# Patient Record
Sex: Female | Born: 1975 | Race: White | Hispanic: No | Smoking: Current every day smoker
Health system: Southern US, Community
[De-identification: ages and names within clinical notes are randomized; demographics above are authoritative.]

## PROBLEM LIST (undated history)

## (undated) DIAGNOSIS — S069X9A Unspecified intracranial injury with loss of consciousness of unspecified duration, initial encounter: Secondary | ICD-10-CM

## (undated) DIAGNOSIS — G9050 Complex regional pain syndrome I, unspecified: Secondary | ICD-10-CM

## (undated) DIAGNOSIS — F329 Major depressive disorder, single episode, unspecified: Secondary | ICD-10-CM

## (undated) DIAGNOSIS — N289 Disorder of kidney and ureter, unspecified: Secondary | ICD-10-CM

## (undated) DIAGNOSIS — S069XAA Unspecified intracranial injury with loss of consciousness status unknown, initial encounter: Secondary | ICD-10-CM

## (undated) DIAGNOSIS — I10 Essential (primary) hypertension: Secondary | ICD-10-CM

## (undated) DIAGNOSIS — R51 Headache: Secondary | ICD-10-CM

## (undated) DIAGNOSIS — R519 Headache, unspecified: Secondary | ICD-10-CM

## (undated) DIAGNOSIS — R911 Solitary pulmonary nodule: Secondary | ICD-10-CM

## (undated) DIAGNOSIS — F32A Depression, unspecified: Secondary | ICD-10-CM

## (undated) HISTORY — PX: ABDOMINAL HYSTERECTOMY: SHX81

## (undated) HISTORY — PX: BRAIN SURGERY: SHX531

## (undated) HISTORY — PX: SHOULDER ARTHROSCOPY: SHX128

---

## 2015-10-10 ENCOUNTER — Emergency Department (HOSPITAL_COMMUNITY): Payer: Medicare Other

## 2015-10-10 ENCOUNTER — Emergency Department (HOSPITAL_COMMUNITY)
Admission: EM | Admit: 2015-10-10 | Discharge: 2015-10-11 | Discharge status: Against Medical Advice | Payer: Medicare Other | Attending: Emergency Medicine | Admitting: Emergency Medicine

## 2015-10-10 ENCOUNTER — Encounter (HOSPITAL_COMMUNITY): Payer: Self-pay | Admitting: Emergency Medicine

## 2015-10-10 DIAGNOSIS — R079 Chest pain, unspecified: Secondary | ICD-10-CM | POA: Diagnosis present

## 2015-10-10 DIAGNOSIS — Z79899 Other long term (current) drug therapy: Secondary | ICD-10-CM | POA: Diagnosis not present

## 2015-10-10 DIAGNOSIS — F17210 Nicotine dependence, cigarettes, uncomplicated: Secondary | ICD-10-CM | POA: Diagnosis not present

## 2015-10-10 DIAGNOSIS — Z791 Long term (current) use of non-steroidal anti-inflammatories (NSAID): Secondary | ICD-10-CM | POA: Insufficient documentation

## 2015-10-10 DIAGNOSIS — I10 Essential (primary) hypertension: Secondary | ICD-10-CM | POA: Insufficient documentation

## 2015-10-10 DIAGNOSIS — F329 Major depressive disorder, single episode, unspecified: Secondary | ICD-10-CM | POA: Insufficient documentation

## 2015-10-10 HISTORY — DX: Essential (primary) hypertension: I10

## 2015-10-10 HISTORY — DX: Unspecified intracranial injury with loss of consciousness status unknown, initial encounter: S06.9XAA

## 2015-10-10 HISTORY — DX: Unspecified intracranial injury with loss of consciousness of unspecified duration, initial encounter: S06.9X9A

## 2015-10-10 HISTORY — DX: Depression, unspecified: F32.A

## 2015-10-10 HISTORY — DX: Major depressive disorder, single episode, unspecified: F32.9

## 2015-10-10 HISTORY — DX: Complex regional pain syndrome I, unspecified: G90.50

## 2015-10-10 LAB — BASIC METABOLIC PANEL
Anion gap: 5 (ref 5–15)
BUN: 17 mg/dL (ref 6–20)
CHLORIDE: 106 mmol/L (ref 101–111)
CO2: 28 mmol/L (ref 22–32)
Calcium: 8.9 mg/dL (ref 8.9–10.3)
Creatinine, Ser: 0.84 mg/dL (ref 0.44–1.00)
GFR calc Af Amer: >60 mL/min (ref >60)
Glucose, Bld: 106 mg/dL — ABNORMAL HIGH (ref 65–99)
Potassium: 3.6 mmol/L (ref 3.5–5.1)
Sodium: 139 mmol/L (ref 135–145)

## 2015-10-10 LAB — CBC
HEMATOCRIT: 40.2 % (ref 36.0–46.0)
HEMOGLOBIN: 13.4 g/dL (ref 12.0–15.0)
MCH: 31.7 pg (ref 26.0–34.0)
MCHC: 33.3 g/dL (ref 30.0–36.0)
MCV: 95 fL (ref 78.0–100.0)
Platelets: 197 10*3/uL (ref 150–400)
RBC: 4.23 MIL/uL (ref 3.87–5.11)
RDW: 12.6 % (ref 11.5–15.5)
WBC: 5.3 10*3/uL (ref 4.0–10.5)

## 2015-10-10 LAB — TROPONIN I: Troponin I: <0.03 ng/mL (ref <0.03)

## 2015-10-10 NOTE — ED Provider Notes (Signed)
CSN: [id number]     Arrival date & time 10/10/15  1928 History   First MD Initiated Contact with Patient 10/10/15 2238     Chief Complaint  Patient presents with  . Chest Pain     (Consider location/radiation/quality/duration/timing/severity/associated sxs/prior Treatment) Patient is a 40 y.o. female presenting with chest pain. The history is provided by the patient. No language interpreter was used.  Chest Pain Pain location:  L chest Pain quality: aching   Pain radiates to:  Does not radiate Pain radiates to the back: no   Pain severity:  Moderate Onset quality:  Gradual Duration:  1 day Timing:  Constant Progression:  Worsening Chronicity:  New Context: stress   Context: not breathing and no movement   Relieved by:  Nothing Worsened by:  Nothing tried Ineffective treatments:  None tried Associated symptoms: no abdominal pain and not vomiting   Risk factors: no birth control and no prior DVT/PE   Pt reports she had a 12 hour bus trip here to visit her family.    Past Medical History  Diagnosis Date  . Traumatic brain injury (HCC)   . Hypertension     Past  . Reflex sympathetic dystrophy   . Depression    Past Surgical History  Procedure Laterality Date  . Shoulder arthroscopy    . Abdominal hysterectomy     Family History  Problem Relation Age of Onset  . Diabetes Mother   . Diabetes Other    Social History  Substance Use Topics  . Smoking status: Current Every Day Smoker -- 1.00 packs/day    Types: Cigarettes  . Smokeless tobacco: Never Used  . Alcohol Use: No   OB History    Gravida Para Term Preterm AB TAB SAB Ectopic Multiple Living   3 3 3             Review of Systems  Cardiovascular: Positive for chest pain.  Gastrointestinal: Negative for vomiting and abdominal pain.  All other systems reviewed and are negative.     Allergies  Celexa and Contrast media  Home Medications   Prior to Admission medications   Medication Sig Start Date End  Date Taking? Authorizing Provider  clonazePAM (KLONOPIN) 1 MG tablet take 1 tablet by mouth three times a day if needed 09/18/15  Yes Historical Provider, MD  ibuprofen (ADVIL,MOTRIN) 200 MG tablet Take 600 mg by mouth every 6 (six) hours as needed.   Yes Historical Provider, MD  QUEtiapine (SEROQUEL) 100 MG tablet TAKE 1 TABLET BY MOUTH EVERY DAY IN THE MORNING & TAKE 2 TABLETS IN THE EVENING X 10 DAYS 07/05/15  Yes Historical Provider, MD   BP 139/93 mmHg  Pulse 90  Temp(Src) 98.1 F (36.7 C) (Oral)  Resp 21  Ht 5\' 8"  (1.727 m)  Wt 58.968 kg  BMI 19.77 kg/m2  SpO2 100% Physical Exam  Constitutional: She is oriented to person, place, and time. She appears well-developed and well-nourished.  HENT:  Head: Normocephalic and atraumatic.  Right Ear: External ear normal.  Left Ear: External ear normal.  Nose: Nose normal.  Mouth/Throat: Oropharynx is clear and moist.  Eyes: Conjunctivae and EOM are normal. Pupils are equal, round, and reactive to light.  Neck: Normal range of motion.  Cardiovascular: Normal rate and normal heart sounds.   Pulmonary/Chest: Effort normal.  Abdominal: Soft. She exhibits no distension.  Musculoskeletal: Normal range of motion.  Neurological: She is alert and oriented to person, place, and time.  Skin: Skin is  warm.  Psychiatric: She has a normal mood and affect.  Nursing note and vitals reviewed.   ED Course  Procedures (including critical care time) Labs Review Labs Reviewed  BASIC METABOLIC PANEL  CBC  TROPONIN I    Imaging Review Dg Chest 2 View  10/10/2015  CLINICAL DATA:  Chest pain with radiation down left arm. EXAM: CHEST  2 VIEW COMPARISON:  None. FINDINGS: The heart size and mediastinal contours are within normal limits. Both lungs are clear. The visualized skeletal structures are unremarkable. IMPRESSION: No active cardiopulmonary disease. Electronically Signed   By: Kennith CenterEric  Mansell M.D.   On: 10/10/2015 20:09   I have personally reviewed  and evaluated these images and lab results as part of my medical decision-making.   EKG Interpretation   Date/Time:  Saturday October 10 2015 19:50:56 EDT Ventricular Rate:  73 PR Interval:  114 QRS Duration: 92 QT Interval:  396 QTC Calculation: 436 R Axis:   83 Text Interpretation:  Normal sinus rhythm Normal ECG No previous ECGs  available Confirmed by RANCOUR  MD, STEPHEN ([id number]) on 10/10/2015 10:12:44  PM      MDM  Pt left ams before ddimer results.  Pt has normal EKg and chest xray   Final diagnoses:  Chest pain, unspecified chest pain type    AMA.    Lonia SkinnerLeslie K Pasadena ParkSofia, PA-C 10/11/15 0041  Bethann BerkshireJoseph Zammit, MD 10/11/15 [phone]

## 2015-10-10 NOTE — ED Notes (Signed)
Pt c/o chest pain with radiation down her L arm. Pt also reports dizziness and nausea. Onset of symptoms yesterday.

## 2015-10-11 LAB — D-DIMER, QUANTITATIVE (NOT AT ARMC)

## 2015-10-11 NOTE — ED Notes (Signed)
Patient elected to leave AMA,

## 2015-10-12 ENCOUNTER — Encounter (HOSPITAL_COMMUNITY): Payer: Self-pay | Admitting: *Deleted

## 2015-10-12 ENCOUNTER — Emergency Department (HOSPITAL_COMMUNITY): Payer: Medicare Other

## 2015-10-12 ENCOUNTER — Emergency Department (HOSPITAL_COMMUNITY)
Admission: EM | Admit: 2015-10-12 | Discharge: 2015-10-12 | Disposition: A | Discharge status: Home / Self Care | Payer: Medicare Other | Attending: Emergency Medicine | Admitting: Emergency Medicine

## 2015-10-12 DIAGNOSIS — F329 Major depressive disorder, single episode, unspecified: Secondary | ICD-10-CM | POA: Diagnosis not present

## 2015-10-12 DIAGNOSIS — I10 Essential (primary) hypertension: Secondary | ICD-10-CM | POA: Insufficient documentation

## 2015-10-12 DIAGNOSIS — R1033 Periumbilical pain: Secondary | ICD-10-CM | POA: Insufficient documentation

## 2015-10-12 DIAGNOSIS — F17210 Nicotine dependence, cigarettes, uncomplicated: Secondary | ICD-10-CM | POA: Diagnosis not present

## 2015-10-12 DIAGNOSIS — R109 Unspecified abdominal pain: Secondary | ICD-10-CM

## 2015-10-12 LAB — CBC
HEMATOCRIT: 43.4 % (ref 36.0–46.0)
HEMOGLOBIN: 14.7 g/dL (ref 12.0–15.0)
MCH: 32.2 pg (ref 26.0–34.0)
MCHC: 33.9 g/dL (ref 30.0–36.0)
MCV: 95.2 fL (ref 78.0–100.0)
Platelets: 217 10*3/uL (ref 150–400)
RBC: 4.56 MIL/uL (ref 3.87–5.11)
RDW: 12.5 % (ref 11.5–15.5)
WBC: 6.3 10*3/uL (ref 4.0–10.5)

## 2015-10-12 LAB — COMPREHENSIVE METABOLIC PANEL
ALT: 11 U/L — ABNORMAL LOW (ref 14–54)
ANION GAP: 7 (ref 5–15)
AST: 17 U/L (ref 15–41)
Albumin: 4.8 g/dL (ref 3.5–5.0)
Alkaline Phosphatase: 70 U/L (ref 38–126)
BILIRUBIN TOTAL: 0.8 mg/dL (ref 0.3–1.2)
BUN: 14 mg/dL (ref 6–20)
CHLORIDE: 106 mmol/L (ref 101–111)
CO2: 27 mmol/L (ref 22–32)
Calcium: 9.7 mg/dL (ref 8.9–10.3)
Creatinine, Ser: 0.67 mg/dL (ref 0.44–1.00)
GFR calc Af Amer: >60 mL/min (ref >60)
Glucose, Bld: 114 mg/dL — ABNORMAL HIGH (ref 65–99)
POTASSIUM: 3.4 mmol/L — AB (ref 3.5–5.1)
Sodium: 140 mmol/L (ref 135–145)
TOTAL PROTEIN: 7.5 g/dL (ref 6.5–8.1)

## 2015-10-12 LAB — TROPONIN I: Troponin I: <0.03 ng/mL (ref <0.03)

## 2015-10-12 LAB — LIPASE, BLOOD: LIPASE: 22 U/L (ref 11–51)

## 2015-10-12 NOTE — ED Provider Notes (Signed)
CSN: [id number]     Arrival date & time 10/12/15  1325 History   First MD Initiated Contact with Patient 10/12/15 [phone]     Chief Complaint  Patient presents with  . Follow-up  . Abdominal Pain     (Consider location/radiation/quality/duration/timing/severity/associated sxs/prior Treatment) Patient is a 40 y.o. female presenting with abdominal pain. The history is provided by the patient (Patient states she's been having some mild lower abdominal pain. Also she stated that her EKG was abnormal and was told to come back to be evaluated).  Abdominal Pain Pain location:  Periumbilical Pain quality: aching   Pain radiates to:  Does not radiate Pain severity:  Mild Onset quality:  Sudden Timing:  Intermittent Progression:  Waxing and waning Chronicity:  Recurrent Context: not alcohol use   Associated symptoms: no chest pain, no cough, no diarrhea, no fatigue and no hematuria     Past Medical History  Diagnosis Date  . Traumatic brain injury (HCC)   . Hypertension     Past  . Reflex sympathetic dystrophy   . Depression    Past Surgical History  Procedure Laterality Date  . Shoulder arthroscopy    . Abdominal hysterectomy     Family History  Problem Relation Age of Onset  . Diabetes Mother   . Diabetes Other    Social History  Substance Use Topics  . Smoking status: Current Every Day Smoker -- 1.00 packs/day    Types: Cigarettes  . Smokeless tobacco: Never Used  . Alcohol Use: No   OB History    Gravida Para Term Preterm AB TAB SAB Ectopic Multiple Living   3 3 3             Review of Systems  Constitutional: Negative for appetite change and fatigue.  HENT: Negative for congestion, ear discharge and sinus pressure.   Eyes: Negative for discharge.  Respiratory: Negative for cough.   Cardiovascular: Negative for chest pain.  Gastrointestinal: Positive for abdominal pain. Negative for diarrhea.  Genitourinary: Negative for frequency and hematuria.  Musculoskeletal:  Negative for back pain.  Skin: Negative for rash.  Neurological: Negative for seizures and headaches.  Psychiatric/Behavioral: Negative for hallucinations.      Allergies  Celexa and Contrast media  Home Medications   Prior to Admission medications   Medication Sig Start Date End Date Taking? Authorizing Provider  clonazePAM (KLONOPIN) 1 MG tablet take 1 tablet by mouth three times a day if needed 09/18/15   Historical Provider, MD  ibuprofen (ADVIL,MOTRIN) 200 MG tablet Take 600 mg by mouth every 6 (six) hours as needed.    Historical Provider, MD  QUEtiapine (SEROQUEL) 100 MG tablet TAKE 1 TABLET BY MOUTH EVERY DAY IN THE MORNING & TAKE 2 TABLETS IN THE EVENING X 10 DAYS 07/05/15   Historical Provider, MD   BP 136/84 mmHg  Pulse 104  Temp(Src) 98.5 F (36.9 C) (Oral)  Resp 16  Ht 5\' 8"  (1.727 m)  Wt 130 lb (58.968 kg)  BMI 19.77 kg/m2  SpO2 99% Physical Exam  Constitutional: She is oriented to person, place, and time. She appears well-developed.  HENT:  Head: Normocephalic.  Eyes: Conjunctivae and EOM are normal. No scleral icterus.  Neck: Neck supple. No thyromegaly present.  Cardiovascular: Normal rate and regular rhythm.  Exam reveals no gallop and no friction rub.   No murmur heard. Pulmonary/Chest: No stridor. She has no wheezes. She has no rales. She exhibits no tenderness.  Abdominal: She exhibits no distension. There  is tenderness. There is no rebound.  Minimal left lower quadrant right lower quadrant tenderness  Musculoskeletal: Normal range of motion. She exhibits no edema.  Lymphadenopathy:    She has no cervical adenopathy.  Neurological: She is oriented to person, place, and time. She exhibits normal muscle tone. Coordination normal.  Skin: No rash noted. No erythema.  Psychiatric: She has a normal mood and affect. Her behavior is normal.    ED Course  Procedures (including critical care time) Labs Review Labs Reviewed  COMPREHENSIVE METABOLIC PANEL -  Abnormal; Notable for the following:    Potassium 3.4 (*)    Glucose, Bld 114 (*)    ALT 11 (*)    All other components within normal limits  LIPASE, BLOOD  CBC  TROPONIN I  URINALYSIS, ROUTINE W REFLEX MICROSCOPIC ([facility])    Imaging Review Dg Chest 2 View  10/10/2015  CLINICAL DATA:  Chest pain with radiation down left arm. EXAM: CHEST  2 VIEW COMPARISON:  None. FINDINGS: The heart size and mediastinal contours are within normal limits. Both lungs are clear. The visualized skeletal structures are unremarkable. IMPRESSION: No active cardiopulmonary disease. Electronically Signed   By: Kennith CenterEric  Mansell M.D.   On: 10/10/2015 20:09   Dg Abd Acute W/chest  10/12/2015  CLINICAL DATA:  Abdominal pain and nausea for 1 month EXAM: DG ABDOMEN ACUTE W/ 1V CHEST COMPARISON:  10/10/2015 FINDINGS: There is no evidence of dilated bowel loops or free intraperitoneal air. No radiopaque calculi or other significant radiographic abnormality is seen. Heart size and mediastinal contours are within normal limits. Both lungs are clear. There is a scoliosis deformity involving the thoracic spine. IMPRESSION: Negative abdominal radiographs.  No acute cardiopulmonary disease. Thoracic scoliosis Electronically Signed   By: Signa Kellaylor  Stroud M.D.   On: 10/12/2015 15:39   I have personally reviewed and evaluated these images and lab results as part of my medical decision-making.   EKG Interpretation None      MDM   Final diagnoses:  Abdominal pain in female    Labs unremarkable abdominal series unremarkable. Patient states she's had many surgeries on her abdomen and she has pain occasionally she never gave us a urine specimen and decided she did not want to wait for the urine before she went home. Also her EKG today appears identical to the one couple days ago. I doubt the patient is having any coronary artery problems.    Bethann BerkshireJoseph Mayjor Ager, MD 10/12/15 [phone]

## 2015-10-12 NOTE — ED Notes (Signed)
Pt as here for CP two days ago. Pt was discharged with no problems. Pt verbalizes she was called by a doctor today and told to come back in because her lab results were back. Her lab results are noted in her chart but are not outside of limits. Pt denies any CP today.

## 2015-10-12 NOTE — ED Provider Notes (Signed)
Late entry: [facility]. Minimal ST elevation in inferior leads, T wave inversions septally, no comparison. Does not meet STEMI criteria, likely early repolarization. Patient left AMA before D-dimer and second troponin.  Left message with patient to return for repeat EKG and troponin.  Glynn OctaveStephen Tadhg Eskew, MD 10/12/15 1401

## 2015-10-12 NOTE — ED Notes (Signed)
Pt state she is having abdominal pain that started since being discharged. Denies v/d; pt is nauseous.

## 2015-10-12 NOTE — Discharge Instructions (Signed)
Follow up with your family md as planned °

## 2016-01-02 ENCOUNTER — Emergency Department
Admit: 2016-01-02 | Disposition: A | Discharge status: Home / Self Care | Source: Home / Self Care | Attending: Physician Assistant | Admitting: Physician Assistant

## 2016-02-13 LAB — HX CBC W/ DIFF
CASE NUMBER: 2017308001494
HX HCT: 42.6 % — NL (ref 36.0–47.0)
HX HGB: 14.5 g/dL — NL (ref 11.8–15.8)
HX MCH: 31 pg — NL (ref 27.0–31.0)
HX MCHC: 34 g/dL — NL (ref 32.0–36.0)
HX MCV: 91 fL — NL (ref 81.0–99.0)
HX MPV: 10.3 fL — NL (ref 7.4–11.5)
HX NRBC PERCENT: 0 % — NL
HX PLATELET: 191 10*3/uL — NL (ref 150.0–400.0)
HX RBC: 4.67 10*6/uL — NL (ref 3.6–5.0)
HX RDW: 12.1 % — NL (ref 11.5–14.5)
HX WBC: 5.2 10*3/uL — NL (ref 3.7–11.2)

## 2016-02-13 LAB — HX GLOMERULAR FILTRATION RATE (ESTIMATED)
CASE NUMBER: 2017308001494
HX AFN AMER GLOMERULAR FILTRATION RATE: 89 mL/min/{1.73_m2}
HX NON-AFN AMER GLOMERULAR FILTRATION RATE: 77 mL/min/{1.73_m2}

## 2016-02-13 LAB — HX COMPREHENSIVE METABOLIC PANEL
CASE NUMBER: 2017308001494
HX ALBUMIN LVL: 4.2 g/dL — NL (ref 3.5–5.0)
HX ALKALINE PHOSPHATASE: 96 U/L — NL (ref 45.0–117.0)
HX ALT: 19 U/L — NL (ref 6.0–78.0)
HX ANION GAP: 7 — NL (ref 3.0–11.0)
HX AST: 15 U/L — NL (ref 6.0–40.0)
HX BILIRUBIN TOTAL: 0.7 mg/dL — NL (ref 0.2–1.2)
HX BUN: 12 mg/dL — NL (ref 7.0–18.0)
HX CALCIUM LVL: 9.1 mg/dL — NL (ref 8.5–10.1)
HX CHLORIDE: 107 mmol/L — NL (ref 98.0–110.0)
HX CO2: 27 mmol/L — NL (ref 21.0–32.0)
HX CREATININE: 0.933 mg/dL — NL (ref 0.55–1.3)
HX GLUCOSE LVL: 120 mg/dL — ABNORMAL HIGH (ref 65.0–110.0)
HX POTASSIUM LVL: 3.8 mmol/L — NL (ref 3.6–5.2)
HX SODIUM LVL: 141 mmol/L — NL (ref 136.0–145.0)
HX TOTAL PROTEIN: 7.7 g/dL — NL (ref 6.0–8.0)

## 2016-02-13 LAB — HX LIPASE LEVEL
CASE NUMBER: 2017308001496
HX LIPASE LVL: 173 U/L — NL (ref 73.0–393.0)

## 2016-02-13 LAB — HX .AUTOMATED DIFF
CASE NUMBER: 2017308001494
HX ABSOLUTE NEUTRO COUNT: 2050 /mm3
HX BASOPHILS: 1 % — NL (ref 0.0–1.0)
HX EOSINOPHILS: 5 % — ABNORMAL HIGH (ref 0.0–3.0)
HX IMMATURE GRANULOCYTES: 0 % — NL (ref 0.0–2.0)
HX LYMPHOCYTES: 43 % — ABNORMAL HIGH (ref 22.0–40.0)
HX MONOCYTES: 11 % — NL (ref 0.0–11.0)
HX NEU CT MEASURED: 2.05
HX NEUTROPHILS: 39 % — ABNORMAL LOW (ref 40.0–71.0)

## 2016-02-14 ENCOUNTER — Inpatient Hospital Stay
Admit: 2016-02-14 | Disposition: A | Discharge status: Home / Self Care | Source: Home / Self Care | Attending: Internal Medicine | Admitting: Internal Medicine

## 2016-02-14 ENCOUNTER — Ambulatory Visit

## 2016-02-14 ENCOUNTER — Ambulatory Visit: Admitting: Urology

## 2016-02-14 LAB — HX URINE DIPSTICK W/REFLEX
CASE NUMBER: 2017308001495
HX UA BILIRUBIN: NEGATIVE
HX UA GLUCOSE: NEGATIVE
HX UA KETONES: NEGATIVE
HX UA NITRITE: NEGATIVE
HX UA PH: 5 (ref 5.0–8.0)
HX UA PROTEIN: NEGATIVE
HX UA RBC: 8 /HPF — ABNORMAL HIGH (ref 0.0–3.0)
HX UA SPECIFIC GRAVITY: 1.012 (ref 1.005–1.03)
HX UA SQUAMOUS EPITHELIAL: 15 /HPF — ABNORMAL HIGH (ref 0.0–5.0)
HX UA UROBILINOGEN: NEGATIVE
HX UA WBC: 21 /HPF — ABNORMAL HIGH (ref 0.0–5.0)

## 2016-02-14 NOTE — H&P (Signed)
Chief Complaint    "abdominal pain,N/V since monday'    History of Present Illness    [name] is a 40 year old female coming in with right flank pain for almost  a week. She describes it as a dull aching discomfort that occurs  intermittently and gets worse with touch. She has felt nauseous and was dry  heaving. Patient denies any dysuria or hematuria. On arrival her labs appeared  benign but a CAT scan done on her abdomen revealed a 3 mm proximal right  ureterolith with minimal perinephric and periureteral stranding. She was given  Levaquin, Toradol and morphine but shall now be admitted for further  management.    Review of Systems    12 point review of system negative, except as in history of present illness    Code Status    Code Status - Ordered    -- 02/14/16 0:20:00 EDT, Full Resuscitation, Constant Order          Physical Exam    Vitals & Measurements    **T:** 97.6 F (Oral) **TMIN:** 97.6 F (Oral) **TMAX:** 98.4 F (Oral)  **HR:** 78 **RR:** 16 **BP:** 115/68 **SpO2:** 98% **O2 Method (L/min):** Room  air **WT:** 70.1 Kg    General: Alert, oriented, not in any acute distress    Eye: PERRL, Normal Conjunctiva    HEENT: Normocephalic, TM's clear, good light reflex, normal hearing, moist  oral mucosa, no pharyngeal erythema, ear canals patent, no sinus tenderness    Neck: supple, nontender, no carotid bruit, no JVD, full ROM    Respiratory: Lungs clear to auscultation, respirations non-labored, breath  sounds equal and regular, no chest wall tenderness    Cardiovascular: Normal Rate, normal rhythm, no gallop, good pulses equal in  all extremities    Gastrointestinal: Abdomen soft, tender over the right side of the abdomen,  non-distended, normal bowel sounds all four quadrants    Musculoskeletal: normal ROM, normal strength, no tenderness, no swelling    Integumentary: Skin intact, warm, No pallor, no rash.    Neurologic: Alert, Oriented, normal sensory, normal motor, no focal  deficits.    Impression and Plan    Obstructive uropathy    Continue hydrating with normal saline at 200 cc an hour.    Urology review in the a.m.    Ordered:    Pyelonephritis    Continue Levaquin 750mg  daily    Follow up urine cultures    Morphine PRN        DVT prophylaxis    Compression sequential device    CMS Two-Midnight Rule    Patient may likely need less than 2 overnight for this admission        Problem List/Past Medical History    RSD    Lupus    Bilateral rotator cuff surgery    Status post cholecystectomy    Status post hysterectomy    Status post hernia surgery    Status post bilateral tubal ligation    Procedure/Surgical History    Laparoscopic Unilateral Salpingo-Oophorectomy (05/24/2011), endometriosus,  hemorrageing cyst, hernia surgery, hysterectomy, rotator cuff surgery left,  tubal ligation, tubal reversal.    Social History    _Tobacco_    Current, 12 year(s).    Family History    Allergies    Darvon (hives)    Contrast Dye (hives)    Darvocet-N 50 (hives)    Skelaxin (swelling)    Ultracef (nausea, hives)    Ultram    Cipro    Medications  _Inpatient_    acetaminophen tablet, 650 mg= 2 tab(s), PO, q4hr, PRN    KlonoPIN, 1 mg= 1 tab(s), PO, TID    morPHINE, 4 mg= 1 mL, IV Push, q4hr, PRN    NaCl 0.9% bolus 1,000 mL, 1000 mL, IV    NaCl 0.9% bolus 1,000 mL, 1000 mL, IV    ondansetron, 4 mg= 2 mL, IV Push, q4hr, PRN    Sodium Chloride 0.9% 1,000 mL, 1000 mL, IV    _Home_    KlonoPIN, 1 mg, PO, TID    Misc Medication, PRN    Diet    Clear Liquid Diet - Ordered    -- 02/14/16 0:25:00 EDT, Non Room Service, Scheduled / PRN          Lab Results    Glucose Lvl: 120 mg/dL High (16/10/96 04:54:09)    BUN: 12 mg/dL (81/19/14 78:29:56)    Creatinine: 0.933 mg/dL (21/30/86 57:84:69)    Afn Amer Glomerular Filtration Rate: 89 ml/min/1.19m2 (02/13/16 19:12:33)    Non-Afn Amer Glomerular Filtration Rate: 77 ml/min/1.50m2 (02/13/16 19:12:34)    Sodium Lvl: 141 mmol/L (02/13/16 19:12:32)    Potassium Lvl:  3.8 mmol/L (02/13/16 19:12:32)    Chloride: 107 mmol/L (02/13/16 19:12:32)    CO2: 27 mmol/L (02/13/16 19:12:32)    Anion Gap: 7 (02/13/16 19:12:32)    Total Protein: 7.7 Gm/dL (62/95/28 41:32:44)    Albumin Lvl: 4.2 Gm/dL (04/12/70 53:66:44)    Calcium Lvl: 9.1 mg/dL (03/47/42 59:56:38)    Bilirubin Total: 0.7 mg/dL (75/64/33 29:51:88)    Alkaline Phosphatase: 96 Units/L (02/13/16 19:12:32)    AST: 15 Units/L (02/13/16 19:12:32)    ALT: 19 Units/L (02/13/16 19:12:32)    Lipase Lvl: 173 Units/L (02/13/16 19:05:30)    WBC: 5.2 thous/mm3 (02/13/16 18:44:37)    RBC: 4.67 Mil/mm3 (02/13/16 18:44:37)    Hgb: 14.5 Gm/dL (41/66/06 30:16:01)    Hct: 42.6 % (02/13/16 18:44:37)    Platelet: 191 thous/mm3 (02/13/16 18:44:37)    MCV: 91 fL (02/13/16 18:44:37)    MCH: 31 pGm (02/13/16 18:44:37)    MCHC: 34 Gm/dL (09/32/35 57:32:20)    RDW: 12.1 % (02/13/16 18:44:37)    MPV: 10.3 fL (02/13/16 18:44:37)    Neutrophils: 39 % Low (02/13/16 18:44:39)    Lymphocytes: 43 % High (02/13/16 18:44:39)    Monocytes: 11 % (02/13/16 18:44:39)    Eosinophils: 5 % High (02/13/16 18:44:39)    Basophils: 1 % (02/13/16 18:44:39)    Immature Granulocytes: 0 % (02/13/16 18:44:39)    NRBC Percent: 0 % (02/13/16 18:44:37)    Absolute Neutro Count: 2050 /mm3 (02/13/16 18:44:39)    UA Color: Yellow1 (02/13/16 21:56:17)    UA Clarity: Slightly-Cloudy (02/13/16 21:56:17)    UA Specific Gravity: 1.012 (02/13/16 21:56:17)    UA pH: 5 (02/13/16 21:56:17)    UA Protein: Negative1 (02/13/16 21:56:17)    UA Glucose: Negative1 (02/13/16 21:56:17)    UA Ketones: Negative1 (02/13/16 21:56:17)    UA Bilirubin: Negative1 (02/13/16 21:56:17)    UA Blood: 1+ Abnormal (02/13/16 21:56:17)    UA Urobilinogen: Negative1 (02/13/16 21:56:17)    UA Nitrite: Negative1 (02/13/16 21:56:17)    UA Leukocyte Esterase: 2+ Abnormal (02/13/16 21:56:17)    UA RBC: 8 /HPF High (02/13/16 21:56:17)    UA WBC: 21 /HPF High (02/13/16 21:56:17)    UA Squamous Epithelial: 15 /HPF High  (02/13/16 21:56:17)    UA Bacteria: 1+ Abnormal (02/13/16 21:56:17)    UA Mucous: Moderate1 Abnormal (02/13/16 21:56:17)  Diagnostic Results        ------        SIGNATURE LINE Electronically signed by Lavetta Nielsen MD, Grover Robinson on 02/14/2016 at  04:46:18 EST

## 2016-02-14 NOTE — Progress Notes (Signed)
**  Progress Notes**    ---    **Patient:** [name], [name]     **Account Number:** [id number]   **Provider:** Molli Hazard A. Noel Gerold, M.D.     **DOB:** 01-Nov-1975 **Age:** 22 Y **Sex:** Female   **Date:** 02/14/2016     **Phone:** [phone]     **Address:** [address] , [place], [id number]     **Pcp:** MD None        * * *         **Subjective:**        ---       **Chief Complaints:**    --- ---         --- ---      **Medical History:**    --- ---        **Objective:**        ---         **Assessment:**        ---         **Plan:**        ---         --- ---          **Provider:** Artist Pais. Noel Gerold, M.D.    ---     **Patient:** [name], [name] **DOB:** [birth year] **Date:** 02/14/2016    ---    Electronically signed by Jeani Hawking on 03/07/2016 at 03:03 PM EST    Sign off status: Completed

## 2016-02-14 NOTE — Op Note (Signed)
__________________________________________________________________________    OPERATIVE REPORT  DATE:  02/15/2016    PREOPERATIVE DIAGNOSIS:  Right ureteral stone.    POSTOPERATIVE DIAGNOSES:  No ureteral stone and no obstruction.    PROCEDURES:  Cystoscopy, right retrograde pyelogram and right ureteroscopy.    SURGEON:  Leola Brazil, M.D.    ASSISTANT:  None.    ANESTHESIA:  General.    FLUIDS:  As per Anesthesia.    ESTIMATED BLOOD LOSS:  Minimal.    INDICATIONS:  The patient is a 40 year old female who came in with some right  flank pain and lower abdominal pain.  She had CT scan done with IV contrast that  showed about a 3-mm to 4-mm proximal right ureteral stone with some mild  hydroureteronephrosis.  She was admitted and hydrated.  I saw her again this  morning.  She was still having pain, although it was lower and would like to  have something done so we are going to take her to the OR for cystoscopy,  ureteroscopy, possible stone manipulation and stent placement.    FINDINGS:  First of all upon fluoroscopy, she had a lot of retained contrast in  her transverse colon from her CT scan done yesterday.  That being said, her  urethra and bladder were normal without stones, tumors or diverticula  appreciated.  There was clear efflux from each ureteral orifice.  A right  retrograde pyelogram showed no evidence of any hydroureter, hydronephrosis or  filling defect and there was no evidence of obstruction as it came out quite  easily and quite quickly.  However, given the fact that this was a small stone,  I did elect to do a right ureteroscopy and I was able to get a small right  ureteroscope all the way renal pelvis and her complete ureter is free of stone.    DESCRIPTION OF PROCEDURE:  After being placed under general anesthesia, she had  already been given some IV antibiotics, she was placed in the dorsal lithotomy  position and then prepped and draped in the usual sterile fashion upon the  cystoscopy  fluoroscopy table.  Fluoroscopy was tested.  That is when I noticed  the contrast remaining in the transverse colon.  A #22-French cystoscope was  placed per urethra up into the bladder.  The bladder was examined with the 30s  and the 70-degree lens with the findings noted as above.  Then with a 30-degree  lens, an 8-French cone tipped catheter and some Hypaque, a right retrograde  pyelogram was performed with the findings noted as above.  After performing the  pyelogram, I then passed a 0.035-inch Glidewire through the ureteral orifice all  the way up into the kidney.  I did even with a small short 4.5 French semi-rigid  ureteroscope, I was able to get into the distal ureter without difficulty but  could not pass beyond the vessels so I then backloaded the scope on the  Glidewire and it easily slipped all the way up to the renal pelvis.  As I looked  all the way up and looked on the way down, there was no evidence of any stones  or obstruction.  After removing scope, the wire was then removed.  I saw efflux  coming from that ureter.  The bladder was emptied.  The cystoscope and sheath  were removed.  I replaced the cystoscope back into the bladder to watch the  ureteral orifice.  I then emptied the bladder and the cystoscope and sheath  were  removed.  The patient tolerated the procedure well.  There were no  complications.  The patient was discharged to the Recovery Room in  hemodynamically stable condition.  From a GU standpoint, she can go home.  She  has passed her stone.  She can follow up with me as an outpatient.    Dictated by:  Aletta Edouard, M.D.    D:  02/15/2016 14:28:25  T:  02/16/2016 06:26:57  E:  02/16/2016 06:26:57  REA/tam  Job# [id number]   SIGNATURE LINE    Electronically signed by Roselee Nova MD, Karita Dralle E on 02/16/2016 at 07:48:19 EST

## 2016-02-14 NOTE — Progress Notes (Signed)
Subjective    complaint of right flank pain denies nausea vomiting diarrhea dysuria    Review of Systems    Objective    Vitals & Measurements    **T:** 97.5 F (Oral) **TMIN:** 97 F (Oral) **TMAX:** 98.4 F (Oral) **HR:**  72 **RR:** 16 **BP:** 135/79 **SpO2:** 98% **O2 Method (L/min):** Room air  **WT:** 70.1 Kg    Physical Exam    General: Alert and oriented, no acute distress    Eye: PERRL, Normal Conjunctiva    HEENT: Normocephalic,    Neck: supple, nontender,    Respiratory: Lungs clear to auscultation, respirations non-labored,    Cardiovascular: Normal Rate, normal rhythm,    Gastrointestinal: Abdomen soft, non tender, non-distended,    Genitourinary: \+ right CVA tenderness.    Neurologic: Alert, Oriented, normal sensory, normal motor, no focal deficits.          Medications    _Inpatient_    acetaminophen tablet, 650 mg= 2 tab(s), PO, q4hr, PRN    cefTRIAXone, 1 Gm= 1 EA, IV Push, q24hr    KlonoPIN, 1 mg= 1 tab(s), PO, TID    morPHINE, 4 mg= 1 mL, IV Push, q4hr, PRN    NaCl 0.9% bolus 1,000 mL, 1000 mL, IV    NaCl 0.9% bolus 1,000 mL, 1000 mL, IV    ondansetron, 4 mg= 2 mL, IV Push, q4hr, PRN    Sodium Chloride 0.9% 1,000 mL, 1000 mL, IV    _Home_    KlonoPIN, 1 mg, PO, TID    Misc Medication, PRN    Lab Results    Glucose Lvl: 120 mg/dL High (16/10/96 04:54:09)    BUN: 12 mg/dL (81/19/14 78:29:56)    Creatinine: 0.933 mg/dL (21/30/86 57:84:69)    Afn Amer Glomerular Filtration Rate: 89 ml/min/1.90m2 (02/13/16 19:12:33)    Non-Afn Amer Glomerular Filtration Rate: 77 ml/min/1.33m2 (02/13/16 19:12:34)    Sodium Lvl: 141 mmol/L (02/13/16 19:12:32)    Potassium Lvl: 3.8 mmol/L (02/13/16 19:12:32)    Chloride: 107 mmol/L (02/13/16 19:12:32)    CO2: 27 mmol/L (02/13/16 19:12:32)    Anion Gap: 7 (02/13/16 19:12:32)    Total Protein: 7.7 Gm/dL (62/95/28 41:32:44)    Albumin Lvl: 4.2 Gm/dL (04/12/70 53:66:44)    Calcium Lvl: 9.1 mg/dL (03/47/42 59:56:38)    Bilirubin Total: 0.7 mg/dL (75/64/33 29:51:88)     Alkaline Phosphatase: 96 Units/L (02/13/16 19:12:32)    AST: 15 Units/L (02/13/16 19:12:32)    ALT: 19 Units/L (02/13/16 19:12:32)    Lipase Lvl: 173 Units/L (02/13/16 19:05:30)    WBC: 5.2 thous/mm3 (02/13/16 18:44:37)    RBC: 4.67 Mil/mm3 (02/13/16 18:44:37)    Hgb: 14.5 Gm/dL (41/66/06 30:16:01)    Hct: 42.6 % (02/13/16 18:44:37)    Platelet: 191 thous/mm3 (02/13/16 18:44:37)    MCV: 91 fL (02/13/16 18:44:37)    MCH: 31 pGm (02/13/16 18:44:37)    MCHC: 34 Gm/dL (09/32/35 57:32:20)    RDW: 12.1 % (02/13/16 18:44:37)    MPV: 10.3 fL (02/13/16 18:44:37)    Neutrophils: 39 % Low (02/13/16 18:44:39)    Lymphocytes: 43 % High (02/13/16 18:44:39)    Monocytes: 11 % (02/13/16 18:44:39)    Eosinophils: 5 % High (02/13/16 18:44:39)    Basophils: 1 % (02/13/16 18:44:39)    Immature Granulocytes: 0 % (02/13/16 18:44:39)    NRBC Percent: 0 % (02/13/16 18:44:37)    Absolute Neutro Count: 2050 /mm3 (02/13/16 18:44:39)    UA Color: Yellow1 (02/13/16 21:56:17)    UA Clarity: Slightly-Cloudy (02/13/16  21:56:17)    UA Specific Gravity: 1.012 (02/13/16 21:56:17)    UA pH: 5 (02/13/16 21:56:17)    UA Protein: Negative1 (02/13/16 21:56:17)    UA Glucose: Negative1 (02/13/16 21:56:17)    UA Ketones: Negative1 (02/13/16 21:56:17)    UA Bilirubin: Negative1 (02/13/16 21:56:17)    UA Blood: 1+ Abnormal (02/13/16 21:56:17)    UA Urobilinogen: Negative1 (02/13/16 21:56:17)    UA Nitrite: Negative1 (02/13/16 21:56:17)    UA Leukocyte Esterase: 2+ Abnormal (02/13/16 21:56:17)    UA RBC: 8 /HPF High (02/13/16 21:56:17)    UA WBC: 21 /HPF High (02/13/16 21:56:17)    UA Squamous Epithelial: 15 /HPF High (02/13/16 21:56:17)    UA Bacteria: 1+ Abnormal (02/13/16 21:56:17)    UA Mucous: Moderate1 Abnormal (02/13/16 21:56:17)            IMPRESSION:    1\. Right obstructive uropathy produced by 3 mm proximal right ureterolith,    manifest by mild right hydroureteronephrosis and minimal    perinephric/periureteral edema.    2\. Please see body of  report for further details.    [1]    Diagnostic Results                  Impression and Plan        -right obstructive uropathy/question pyelonephritis/nephrolithiasis; continue IV hydration, start patient on ceftriaxone, continue pain medication, appreciated neurology consult, check urine culture.        -DVT prophylaxis: early ambulation encouraged    [1] CT Abdomen and Pelvis C-; Beckey Downing MD, Tasia Catchings 02/13/2016 22:05 EDT    SIGNATURE LINE Electronically signed by Ruby Cola MD, Larkyn Greenberger (HOSP) on 02/14/2016  at 13:38:48 EST

## 2016-02-15 ENCOUNTER — Ambulatory Visit: Admitting: Urology

## 2016-02-15 NOTE — Progress Notes (Signed)
**  Progress Notes**    ---    **Patient:** [name], [name]     **Account Number:** [id number]   **Provider:** Jacie Tristan E. Roselee Nova, M.D.     **DOB:** 02/06/1976 **Age:** 46 Y **Sex:** Female   **Date:** 02/15/2016     **Phone:** [phone]     **Address:** [address] , [place], [place]     **Pcp:** MD None        * * *         **Subjective:**        ---       **Chief Complaints:**    --- ---         --- ---      **Medical History:**    --- ---        **Objective:**        ---         **Assessment:**        ---         **Plan:**        ---         --- ---          **Provider:** Reginald Weida E. Roselee Nova, M.D.    ---     **Patient:** [name], [name] **DOB:** 1975/06/03 **Date:** 02/15/2016    ---    Electronically signed by Jeani Hawking on 03/07/2016 at 03:03 PM EST    Sign off status: Completed

## 2016-02-16 ENCOUNTER — Ambulatory Visit

## 2016-02-16 ENCOUNTER — Emergency Department
Admit: 2016-02-16 | Disposition: A | Discharge status: Home / Self Care | Source: Home / Self Care | Attending: Emergency Medicine | Admitting: Emergency Medicine

## 2016-02-16 LAB — HX GLOMERULAR FILTRATION RATE (ESTIMATED)
CASE NUMBER: 2017311003057
HX AFN AMER GLOMERULAR FILTRATION RATE: 63 mL/min/{1.73_m2}
HX NON-AFN AMER GLOMERULAR FILTRATION RATE: 55 mL/min/{1.73_m2}

## 2016-02-16 LAB — HX CBC W/ DIFF
CASE NUMBER: 2017311003057
HX HCT: 36.4 % — NL (ref 36.0–47.0)
HX HGB: 12.7 g/dL — NL (ref 11.8–15.8)
HX MCH: 31 pg — NL (ref 27.0–31.0)
HX MCHC: 34.9 g/dL — NL (ref 32.0–36.0)
HX MCV: 89 fL — NL (ref 81.0–99.0)
HX MPV: 10.8 fL — NL (ref 7.4–11.5)
HX NRBC PERCENT: 0 % — NL
HX PLATELET: 158 10*3/uL — NL (ref 150.0–400.0)
HX RBC: 4.07 10*6/uL — NL (ref 3.6–5.0)
HX RDW: 11.9 % — NL (ref 11.5–14.5)
HX WBC: 7.2 10*3/uL — NL (ref 3.7–11.2)

## 2016-02-16 LAB — HX URINE DIPSTICK W/REFLEX
CASE NUMBER: 2017311002863
HX UA BILIRUBIN: NEGATIVE — NL
HX UA GLUCOSE: NEGATIVE — NL
HX UA NITRITE: NEGATIVE — NL
HX UA PH: 6 — NL (ref 5.0–8.0)
HX UA PROTEIN: NEGATIVE — NL
HX UA RBC: >182 — ABNORMAL HIGH (ref 0.0–3.0)
HX UA SPECIFIC GRAVITY: 1.012 — NL (ref 1.005–1.03)
HX UA SQUAMOUS EPITHELIAL: 7 /HPF — ABNORMAL HIGH (ref 0.0–5.0)
HX UA UROBILINOGEN: NEGATIVE — NL
HX UA WBC: 16 /HPF — ABNORMAL HIGH (ref 0.0–5.0)

## 2016-02-16 LAB — HX .AUTOMATED DIFF
CASE NUMBER: 2017311003057
HX ABSOLUTE NEUTRO COUNT: 4420 /mm3
HX BASOPHILS: 0 % — NL (ref 0.0–1.0)
HX EOSINOPHILS: 1 % — NL (ref 0.0–3.0)
HX IMMATURE GRANULOCYTES: 0 % — NL (ref 0.0–2.0)
HX LYMPHOCYTES: 27 % — NL (ref 22.0–40.0)
HX MONOCYTES: 10 % — NL (ref 0.0–11.0)
HX NEU CT MEASURED: 4.42
HX NEUTROPHILS: 61 % — NL (ref 40.0–71.0)

## 2016-02-16 LAB — HX URINE CULTURE
CASE NUMBER: 2017309000834
HX F: 10000
HX P: NO GROWTH

## 2016-02-17 ENCOUNTER — Ambulatory Visit: Admitting: Urology

## 2016-02-17 LAB — HX COMPREHENSIVE METABOLIC PANEL
CASE NUMBER: 2017311003057
HX ALBUMIN LVL: 3.4 g/dL — ABNORMAL LOW (ref 3.5–5.0)
HX ALKALINE PHOSPHATASE: 74 U/L (ref 45.0–117.0)
HX ALT: 21 U/L (ref 6.0–78.0)
HX ANION GAP: 8 (ref 3.0–11.0)
HX AST: 16 U/L (ref 6.0–40.0)
HX BILIRUBIN TOTAL: 0.7 mg/dL (ref 0.2–1.2)
HX BUN: 9 mg/dL (ref 7.0–18.0)
HX CALCIUM LVL: 8.5 mg/dL (ref 8.5–10.1)
HX CHLORIDE: 106 mmol/L (ref 98.0–110.0)
HX CO2: 29 mmol/L (ref 21.0–32.0)
HX CREATININE: 1.24 mg/dL (ref 0.55–1.3)
HX GLUCOSE LVL: 87 mg/dL (ref 65.0–110.0)
HX POTASSIUM LVL: 3.6 mmol/L (ref 3.6–5.2)
HX SODIUM LVL: 143 mmol/L (ref 136.0–145.0)
HX TOTAL PROTEIN: 6 g/dL (ref 6.0–8.0)

## 2016-02-17 LAB — HX STONE (CALCULI) ANALYSIS: CASE NUMBER: 2017310002268

## 2016-02-17 NOTE — Progress Notes (Signed)
* * *        **  [name]**    --- ---    21 Y old Female, DOB: May 22, 1975    [address] , [place], [place] [address]    Home: [phone]    Provider: Aletta Edouard        * * *    Telephone Encounter    ---    Answered by   Stephanie Coup  Date: 02/17/2016         Time: 11:59 AM    Caller   PT    --- ---            Reason   pt is constipated - she will call pcp            Message                      PT had stent put in on Monday afternoon and decided to leave the hospital the same day and now has nausea and pain. Cannot eat [phone]                Action Taken   [facility] 02/17/2016 11:59:15 AM > Leahy,Nancy 02/17/2016  12:36:26 PM > did you leave her with a stent? and what is the f/u plan for  her? Carinna Newhart E 02/17/2016 12:47:35 PM > No, she does not have a stent.  she passed her stone. I did a quick uretreoscopy on her and it was negative..  she has bowel issues, and I do not think this is renal colic. I need to see  her in a few weeks ( a stone analysis is pending) and she needs to go to her  primary care or back to the ED [facility] 02/17/2016 12:59:29 PM > called pt -  she is constipated again - she will call pcp                * * *                ---          * * *          Patient: [name] DOB: Oct 18, 1975 Provider: Aletta Edouard  02/17/2016    ---    Note generated by eClinicalWorks EMR/PM Software ([email or web address])

## 2016-02-18 LAB — HX URINE CULTURE
CASE NUMBER: 2017311003054
HX F: NO GROWTH
HX P: NO GROWTH

## 2016-02-19 ENCOUNTER — Ambulatory Visit: Admitting: Urology

## 2016-02-19 ENCOUNTER — Emergency Department
Admit: 2016-02-19 | Disposition: A | Discharge status: Home / Self Care | Source: Home / Self Care | Attending: Emergency Medicine | Admitting: Emergency Medicine

## 2016-02-19 LAB — HX URINE DIPSTICK W/REFLEX
CASE NUMBER: 2017314002092
HX UA BILIRUBIN: NEGATIVE — NL
HX UA GLUCOSE: NEGATIVE — NL
HX UA KETONES: NEGATIVE — NL
HX UA LEUKOCYTE ESTERASE: NEGATIVE — NL
HX UA NITRITE: NEGATIVE — NL
HX UA PH: 7 — NL (ref 5.0–8.0)
HX UA PROTEIN: NEGATIVE — NL
HX UA RBC: 1 /HPF — NL (ref 0.0–3.0)
HX UA SPECIFIC GRAVITY: 1.011 — NL (ref 1.005–1.03)
HX UA SQUAMOUS EPITHELIAL: 19 /HPF — ABNORMAL HIGH (ref 0.0–5.0)
HX UA UROBILINOGEN: NEGATIVE — NL
HX UA WBC: 21 /HPF — ABNORMAL HIGH (ref 0.0–5.0)

## 2016-02-19 LAB — HX .AUTOMATED DIFF
CASE NUMBER: 2017314002210
HX ABSOLUTE NEUTRO COUNT: 3210 /mm3
HX BASOPHILS: 1 % — NL (ref 0.0–1.0)
HX EOSINOPHILS: 4 % — ABNORMAL HIGH (ref 0.0–3.0)
HX IMMATURE GRANULOCYTES: 0 % — NL (ref 0.0–2.0)
HX LYMPHOCYTES: 36 % — NL (ref 22.0–40.0)
HX MONOCYTES: 8 % — NL (ref 0.0–11.0)
HX NEU CT MEASURED: 3.21
HX NEUTROPHILS: 51 % — NL (ref 40.0–71.0)

## 2016-02-19 LAB — HX CBC W/ DIFF
CASE NUMBER: 2017314002210
HX HCT: 35.5 % — ABNORMAL LOW (ref 36.0–47.0)
HX HGB: 12.2 g/dL — NL (ref 11.8–15.8)
HX MCH: 31 pg — NL (ref 27.0–31.0)
HX MCHC: 34.4 g/dL — NL (ref 32.0–36.0)
HX MCV: 92 fL — NL (ref 81.0–99.0)
HX MPV: 10.1 fL — NL (ref 7.4–11.5)
HX NRBC PERCENT: 0 % — NL
HX PLATELET: 192 10*3/uL — NL (ref 150.0–400.0)
HX RBC: 3.88 10*6/uL — NL (ref 3.6–5.0)
HX RDW: 11.9 % — NL (ref 11.5–14.5)
HX WBC: 6.3 10*3/uL — NL (ref 3.7–11.2)

## 2016-02-19 LAB — HX COMPREHENSIVE METABOLIC PANEL
CASE NUMBER: 2017314002210
HX ALBUMIN LVL: 3.7 g/dL — NL (ref 3.5–5.0)
HX ALKALINE PHOSPHATASE: 70 U/L — NL (ref 45.0–117.0)
HX ALT: 18 U/L — NL (ref 6.0–78.0)
HX ANION GAP: 6 — NL (ref 3.0–11.0)
HX AST: 11 U/L — NL (ref 6.0–40.0)
HX BILIRUBIN TOTAL: 0.3 mg/dL — NL (ref 0.2–1.2)
HX BUN: 8 mg/dL — NL (ref 7.0–18.0)
HX CALCIUM LVL: 8.9 mg/dL — NL (ref 8.5–10.1)
HX CHLORIDE: 108 mmol/L — NL (ref 98.0–110.0)
HX CO2: 30 mmol/L — NL (ref 21.0–32.0)
HX CREATININE: 0.64 mg/dL — NL (ref 0.55–1.3)
HX GLUCOSE LVL: 86 mg/dL — NL (ref 65.0–110.0)
HX POTASSIUM LVL: 3.7 mmol/L — NL (ref 3.6–5.2)
HX SODIUM LVL: 144 mmol/L — NL (ref 136.0–145.0)
HX TOTAL PROTEIN: 6.8 g/dL — NL (ref 6.0–8.0)

## 2016-02-19 LAB — HX GLOMERULAR FILTRATION RATE (ESTIMATED)
CASE NUMBER: 2017314002210
HX AFN AMER GLOMERULAR FILTRATION RATE: >90
HX NON-AFN AMER GLOMERULAR FILTRATION RATE: >90

## 2016-02-19 NOTE — Progress Notes (Signed)
* * *        **  [name]**    --- ---    61 Y old Female, DOB: [birth year]    [address] , [place], [place] [address]    Home: [phone]    Provider: Aletta Edouard        * * *    Telephone Encounter    ---    Answered by   Rivka Barbara  Date: 02/19/2016         Time: 01:41 PM    Message                      still having pain.  she should coonsider er visit        --- ---                * * *                ---          * * *          Patient: [name], [name] DOB: [birth year] Provider: Aletta Edouard  02/19/2016    ---    Note generated by eClinicalWorks EMR/PM Software ([email or web address])

## 2016-08-27 ENCOUNTER — Emergency Department
Admit: 2016-08-27 | Disposition: A | Discharge status: Home / Self Care | Source: Home / Self Care | Attending: Emergency Medicine | Admitting: Emergency Medicine

## 2016-08-27 LAB — HX CBC W/ DIFF
CASE NUMBER: 2018139000392
HX HCT: 42.5 % — NL (ref 36.0–47.0)
HX HGB: 13.9 g/dL — NL (ref 11.8–15.8)
HX MCH: 31 pg — NL (ref 27.0–31.0)
HX MCHC: 32.7 g/dL — NL (ref 32.0–36.0)
HX MCV: 96 fL — NL (ref 81.0–99.0)
HX MPV: 9.4 fL — NL (ref 7.4–11.5)
HX NRBC PERCENT: 0 % — NL
HX PLATELET: 268 10*3/uL — NL (ref 150.0–400.0)
HX RBC: 4.42 10*6/uL — NL (ref 3.6–5.0)
HX RDW: 13.2 % — NL (ref 11.5–14.5)
HX WBC: 3.8 10*3/uL — NL (ref 3.7–11.2)

## 2016-08-27 LAB — HX .AUTOMATED DIFF
CASE NUMBER: 2018139000392
HX ABSOLUTE NEUTRO COUNT: 1240 /mm3
HX BASOPHILS: 0 % — NL (ref 0.0–1.0)
HX EOSINOPHILS: 4 % — ABNORMAL HIGH (ref 0.0–3.0)
HX IMMATURE GRANULOCYTES: 0 % — NL (ref 0.0–2.0)
HX LYMPHOCYTES: 50 % — ABNORMAL HIGH (ref 22.0–40.0)
HX MONOCYTES: 13 % — ABNORMAL HIGH (ref 0.0–11.0)
HX NEU CT MEASURED: 1.24
HX NEUTROPHILS: 33 % — ABNORMAL LOW (ref 40.0–71.0)

## 2016-08-27 LAB — HX COMPREHENSIVE METABOLIC PANEL
CASE NUMBER: 2018139000392
HX ALBUMIN LVL: 4.1 g/dL — NL (ref 3.5–5.0)
HX ALKALINE PHOSPHATASE: 92 U/L — NL (ref 45.0–117.0)
HX ALT: 31 U/L — NL (ref 6.0–78.0)
HX ANION GAP: 3 — NL (ref 3.0–11.0)
HX AST: 18 U/L — NL (ref 6.0–40.0)
HX BILIRUBIN TOTAL: 0.3 mg/dL — NL (ref 0.2–1.2)
HX BUN: 17 mg/dL — NL (ref 7.0–18.0)
HX CALCIUM LVL: 9.4 mg/dL — NL (ref 8.5–10.1)
HX CHLORIDE: 108 mmol/L — NL (ref 98.0–110.0)
HX CO2: 31 mmol/L — NL (ref 21.0–32.0)
HX CREATININE: 0.775 mg/dL — NL (ref 0.55–1.3)
HX GLUCOSE LVL: 95 mg/dL — NL (ref 65.0–110.0)
HX POTASSIUM LVL: 3.4 mmol/L — ABNORMAL LOW (ref 3.6–5.2)
HX SODIUM LVL: 142 mmol/L — NL (ref 136.0–145.0)
HX TOTAL PROTEIN: 7.5 g/dL — NL (ref 6.0–8.0)

## 2016-08-27 LAB — HX BLUE TOP TO HOLD: CASE NUMBER: 2018139000398

## 2016-08-27 LAB — HX GLOMERULAR FILTRATION RATE (ESTIMATED)
CASE NUMBER: 2018139000392
HX AFN AMER GLOMERULAR FILTRATION RATE: >90
HX NON-AFN AMER GLOMERULAR FILTRATION RATE: >90

## 2016-08-27 LAB — HX SST GOLD TUBE TO HOLD: CASE NUMBER: 2018139000398

## 2016-08-27 LAB — HX MAGNESIUM LEVEL
CASE NUMBER: 2018139000392
HX MAGNESIUM LVL: 2.1 mg/dL — NL (ref 1.8–2.4)

## 2016-08-27 NOTE — ED Notes (Signed)
Please click on link to see document

## 2016-11-23 ENCOUNTER — Ambulatory Visit: Admitting: Emergency Medicine

## 2016-11-23 ENCOUNTER — Emergency Department
Admit: 2016-11-23 | Discharge status: Against Medical Advice | Source: Home / Self Care | Attending: Emergency Medicine | Admitting: Emergency Medicine
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
## 2016-11-23 NOTE — ED Provider Notes (Signed)
.  .  Name: [name], [name]  MRN: [id number]  Age: 41 yrs  Sex: Female  DOB: 05-17-1975  Arrival Date: 11/23/2016  Arrival Time: 07:44  Account#: [id number]  Bed ASW1  PCP: Graylin Shiver, A  Chief Complaint: Low Back Pain  .  Presentation:  08/15  08:01 Presenting complaint: pt told pre-triage tech she was going     ac21        outside. pt seen across the Tstation.  08:24 Presenting complaint: Patient states: lower back pain s/p       ac21        spinal tap two months ago. reports headache have improved. pt        with hx of TBI 4 years ago, reports "roaming' since then.        reports no worsening symptoms.  08:24 Acuity: Adult 3                                                 ac21  08:24 Method Of Arrival: Walk In                                      ac21  .  Historical:  - Allergies:  07:50 IV Dye, Iodine Containing;                                      mm28  07:50 ACETAMINOPHEN;                                                  mm28  07:50 Tramadol HCl;                                                   mm28  07:50 metaxalone;                                                     mm28  - Home Meds:  08:26 None [Active];                                                  ac21  - PMHx:  08:26 TBI; PTSD; CRPS;                                                ac21  .  - Social history: Smoking status: Patient states was never    smoker of tobacco. Patient/guardian denies using alcohol,    street drugs, Preferred Language:  English The patient lives    with family.  .  .  Screening:  08:26 SEPSIS SCREENING - Temp > 38.3 or < 36.0 No - Heart Rate > 90   ac21        No - Respiratory > 20 No - SBP < 90 No Does this patient have a        suspected source of infection at this timequestion No SIRS Criteria (>        = 2) No. Safety screen: Patient feels safe. Suicide Screening:        Patients presentation: Patient denies thoughts of harm.        Nutritional screening: No deficits noted. Tuberculosis        screening: No symptoms or  risk factors identified. Fall Risk        None identified. Exposure Risk/Travel Screening: None        identified.  .  Vital Signs:  07:49 BP 131 / 88 Right Arm Sitting (auto/reg); Pulse 97 Monitor;     adg        Resp 16 Spontaneous; Temp 37(O); Pulse Ox 97% on R/A; Weight  .  Name:[name], [name]  [id number]  [id number]  Page 1 of 3  %%PAGE  .  Name: [name], [name]  MRN: [id number]  Age: 38 yrs  Sex: Female  DOB: [birth year]  Arrival Date: 11/23/2016  Arrival Time: 07:44  Account#: [id number]  Bed ASW1  PCP: Graylin Shiver, A  Chief Complaint: Low Back Pain  .        63.5 kg (R); Height 5 ft. 8 in. (172.72 cm) (R); Pain 3/10;  07:49 Body Mass Index 21.29 (63.50 kg, 172.72 cm)                     adg  .  OB/GYN:  07:49 LMP N/A - Hysterectomy                                          adg  .  Neuro Vital Signs:  08:26 GCS: 15,                                                        ac21  .  Triage Assessment:  08:26 General: Appears in no apparent distress, comfortable, Behavior ac21        is cooperative. Neuro: Level of Consciousness is awake, alert,        Oriented to person, place, time, Speech is normal, Facial        symmetry appears normal, Left Pupil is PERRL. Right Pupil is        PERRL. Respiratory: Airway is patent Trachea midline        Respiratory effort is even, unlabored, Respiratory pattern is        regular, symmetrical. Skin: Skin is intact, is healthy with        good turgor, Skin is pink, warm / dry.  .  Assessment:  09:30 Reassessment: Assumed care of this Patient after her placement  jg8        in the bed. The Triage Note was reviewed and the Patient has no        new  c/o. The Patient was evaluated by MD. Cathlean Sauer taken. Dispo        pending.  10:57 Reassessment: Patient not in room at 1000. The Johnny was on    jg8        the bed. MD notified.  .  Observations:  07:47 Patient arrived in ED.                                          adg  07:47 Patient Visited By: Charise Killian                       adg  07:51 Patient Visited By: Alvy Beal                                jp21  08:25 Triage Completed.                                               ac21  08:32 Patient Visited By: Marion Downer                                edg  .  Interventions:  07:53 Demo Sheet Scanned into Chart                                   mm12  .  Outcome:  10:50 Patient left against medical advice.                            ac21  10:50 Patient left the ED.                                            ac21  10:57 Discharged to [facility].                                              jg8  .  .  Name:[name], [name]  [id number]  [id number]  Page 2 of 3  %%PAGE  .  Name: [name], [name]  MRN: [id number]  Age: 22 yrs  Sex: Female  DOB: 07/20/1975  Arrival Date: 11/23/2016  Arrival Time: 07:44  Account#: [id number]  Bed ASW1  PCP: Graylin Shiver, A  Chief Complaint: Low Back Pain  .  Signatures:  Marion Downer                            MD   edg  Graceann Congress                        Sec  mm12  Verl Dicker                         RN  jg8  Dennard Nip                             BSN  [address] [place]                           RN   ac21  Poggi, [place]                            PA   jp21  Grammerstorf, Annalise                  CCT  adg  .  .  .  .  .  .  .  .  .  .  .  .  .  .  .  .  .  .  .  .  .  .  .  .  .  .  .  .  .  .  .  .  .  .  .  Name:[name], [name]  [id number]  [id number]  Page 3 of 3  .  %%END

## 2017-03-26 ENCOUNTER — Other Ambulatory Visit: Payer: Self-pay

## 2017-03-26 ENCOUNTER — Emergency Department (HOSPITAL_COMMUNITY)
Admission: EM | Admit: 2017-03-26 | Discharge: 2017-03-26 | Disposition: A | Discharge status: Home / Self Care | Payer: Medicare Other | Attending: Emergency Medicine | Admitting: Emergency Medicine

## 2017-03-26 ENCOUNTER — Encounter (HOSPITAL_COMMUNITY): Payer: Self-pay | Admitting: Emergency Medicine

## 2017-03-26 DIAGNOSIS — N12 Tubulo-interstitial nephritis, not specified as acute or chronic: Secondary | ICD-10-CM | POA: Diagnosis not present

## 2017-03-26 DIAGNOSIS — Z79899 Other long term (current) drug therapy: Secondary | ICD-10-CM | POA: Diagnosis not present

## 2017-03-26 DIAGNOSIS — I10 Essential (primary) hypertension: Secondary | ICD-10-CM | POA: Diagnosis not present

## 2017-03-26 DIAGNOSIS — F17210 Nicotine dependence, cigarettes, uncomplicated: Secondary | ICD-10-CM | POA: Diagnosis not present

## 2017-03-26 DIAGNOSIS — R1030 Lower abdominal pain, unspecified: Secondary | ICD-10-CM | POA: Diagnosis present

## 2017-03-26 HISTORY — DX: Disorder of kidney and ureter, unspecified: N28.9

## 2017-03-26 HISTORY — DX: Solitary pulmonary nodule: R91.1

## 2017-03-26 LAB — CBC
HCT: 42.5 % (ref 36.0–46.0)
HEMOGLOBIN: 13.6 g/dL (ref 12.0–15.0)
MCH: 31.1 pg (ref 26.0–34.0)
MCHC: 32 g/dL (ref 30.0–36.0)
MCV: 97.3 fL (ref 78.0–100.0)
Platelets: 236 10*3/uL (ref 150–400)
RBC: 4.37 MIL/uL (ref 3.87–5.11)
RDW: 13.9 % (ref 11.5–15.5)
WBC: 6.3 10*3/uL (ref 4.0–10.5)

## 2017-03-26 LAB — COMPREHENSIVE METABOLIC PANEL
ALBUMIN: 4.4 g/dL (ref 3.5–5.0)
ALK PHOS: 75 U/L (ref 38–126)
ALT: 15 U/L (ref 14–54)
ANION GAP: 6 (ref 5–15)
AST: 17 U/L (ref 15–41)
BILIRUBIN TOTAL: 0.7 mg/dL (ref 0.3–1.2)
BUN: 17 mg/dL (ref 6–20)
CALCIUM: 9.9 mg/dL (ref 8.9–10.3)
CO2: 31 mmol/L (ref 22–32)
Chloride: 107 mmol/L (ref 101–111)
Creatinine, Ser: 0.71 mg/dL (ref 0.44–1.00)
GFR calc non Af Amer: >60 mL/min (ref >60)
GLUCOSE: 81 mg/dL (ref 65–99)
Potassium: 4.1 mmol/L (ref 3.5–5.1)
Sodium: 144 mmol/L (ref 135–145)
TOTAL PROTEIN: 7 g/dL (ref 6.5–8.1)

## 2017-03-26 LAB — URINALYSIS, ROUTINE W REFLEX MICROSCOPIC
BILIRUBIN URINE: NEGATIVE
GLUCOSE, UA: NEGATIVE mg/dL
Ketones, ur: NEGATIVE mg/dL
NITRITE: NEGATIVE
Protein, ur: NEGATIVE mg/dL
SPECIFIC GRAVITY, URINE: 1.013 (ref 1.005–1.030)
pH: 6 (ref 5.0–8.0)

## 2017-03-26 LAB — LIPASE, BLOOD: Lipase: 23 U/L (ref 11–51)

## 2017-03-26 MED ORDER — CEPHALEXIN 500 MG PO CAPS
500.0000 mg | ORAL_CAPSULE | Freq: Two times a day (BID) | ORAL | 0 refills | Status: AC
Start: 2017-03-26 — End: ?

## 2017-03-26 MED ORDER — TRAMADOL HCL 50 MG PO TABS: 50.0000 mg | ORAL_TABLET | Freq: Four times a day (QID) | ORAL | 0 refills | Status: AC | PRN

## 2017-03-26 MED ORDER — ONDANSETRON HCL 4 MG/2ML IJ SOLN
4.0000 mg | Freq: Once | INTRAMUSCULAR | Status: AC
  Administered 2017-03-26: 4 mg via INTRAVENOUS
  Filled 2017-03-26: qty 2

## 2017-03-26 MED ORDER — NAPROXEN 500 MG PO TABS: 500.0000 mg | ORAL_TABLET | Freq: Two times a day (BID) | ORAL | 0 refills | Status: AC

## 2017-03-26 MED ORDER — ONDANSETRON 8 MG PO TBDP: 8.0000 mg | ORAL_TABLET | Freq: Three times a day (TID) | ORAL | 0 refills | Status: AC | PRN

## 2017-03-26 MED ORDER — SODIUM CHLORIDE 0.9 % IV BOLUS (SEPSIS)
1000.0000 mL | Freq: Once | INTRAVENOUS | Status: AC
Start: 2017-03-26 — End: 2017-03-26
  Administered 2017-03-26: 1000 mL via INTRAVENOUS

## 2017-03-26 MED ORDER — KETOROLAC TROMETHAMINE 30 MG/ML IJ SOLN
15.0000 mg | Freq: Once | INTRAMUSCULAR | Status: AC
  Administered 2017-03-26: 15 mg via INTRAVENOUS
  Filled 2017-03-26: qty 1

## 2017-03-26 MED ORDER — DEXTROSE 5 % IV SOLN
1.0000 g | Freq: Once | INTRAVENOUS | Status: AC
  Administered 2017-03-26: 1 g via INTRAVENOUS
  Filled 2017-03-26: qty 10

## 2017-03-26 NOTE — ED Provider Notes (Signed)
[facility] EMERGENCY DEPARTMENT Provider Note   CSN: [id number] Arrival date & time: 03/26/17  1125     History   Chief Complaint Chief Complaint  Patient presents with  . Abdominal Pain    HPI Alicia Avery is a 41 y.o. female.  HPI Pt presents to the ED for evaluation of abdominal pain.  Patient states she is having bilateral lower abdomen and.  Started a couple days ago.  Patient is having some nausea but no vomiting or diarrhea.  Patient states she is intermittently had some discomfort with urination is felt feverish.  Patient has a history of multiple chronic recurrent abdominal issues.  She states she has had a hysterectomy, tenectomy and a cholecystectomy.  She also had a hernia repair and is if that is causing some of her lumbar abdominal pain problems.  Patient also mentions having issues with headaches.  This is a chronic issue for her but not a new complaint. Past Medical History:  Diagnosis Date  . Depression   . Hypertension    Past  . Lung nodule   . Reflex sympathetic dystrophy   . Renal disorder   . Traumatic brain injury (HCC)     There are no active problems to display for this patient.   Past Surgical History:  Procedure Laterality Date  . ABDOMINAL HYSTERECTOMY    . BRAIN SURGERY    . SHOULDER ARTHROSCOPY      OB History    Gravida Para Term Preterm AB Living   3 3 3          SAB TAB Ectopic Multiple Live Births                   Home Medications    Prior to Admission medications   Medication Sig Start Date End Date Taking? Authorizing Provider  diazepam (VALIUM) 10 MG tablet Take 1 tablet by mouth 3 (three) times daily as needed. 03/20/17  Yes [provider]  cephALEXin (KEFLEX) 500 MG capsule Take 1 capsule (500 mg total) by mouth 2 (two) times daily. 03/26/17   Linwood Dibbles, MD  naproxen (NAPROSYN) 500 MG tablet Take 1 tablet (500 mg total) by mouth 2 (two) times daily with a meal. As needed for pain 03/26/17   Linwood Dibbles, MD   ondansetron (ZOFRAN ODT) 8 MG disintegrating tablet Take 1 tablet (8 mg total) by mouth every 8 (eight) hours as needed for nausea or vomiting. 03/26/17   Linwood Dibbles, MD  traMADol (ULTRAM) 50 MG tablet Take 1 tablet (50 mg total) by mouth every 6 (six) hours as needed. 03/26/17   Linwood Dibbles, MD    Family History Family History  Problem Relation Age of Onset  . Diabetes Mother   . Diabetes Other     Social History Social History   Tobacco Use  . Smoking status: Current Every Day Smoker    Packs/day: 1.00    Types: Cigarettes  . Smokeless tobacco: Never Used  Substance Use Topics  . Alcohol use: Yes    Comment: occasional  . Drug use: No     Allergies   Celexa [citalopram hydrobromide]; Ciprofloxacin; Contrast media [iodinated diagnostic agents]; Metaxalone; and Propoxyphene   Review of Systems Review of Systems  HENT:       Intermittent eye twitching  Neurological: Positive for headaches.       Chronic headaches, history of prior traumatic brain injury  All other systems reviewed and are negative.    Physical Exam Updated  Vital Signs BP 107/79   Pulse 78   Temp 97.8 F (36.6 C) (Oral)   Resp 16   Ht 1.727 m (5\' 8" )   Wt 61.2 kg (135 lb)   SpO2 97%   BMI 20.53 kg/m   Physical Exam  Constitutional:  Non-toxic appearance. She does not appear ill. No distress.  HENT:  Head: Normocephalic and atraumatic.  Right Ear: External ear normal.  Left Ear: External ear normal.  Eyes: Conjunctivae are normal. Right eye exhibits no discharge. Left eye exhibits no discharge. No scleral icterus.  Neck: Neck supple. No tracheal deviation present.  Cardiovascular: Normal rate, regular rhythm and intact distal pulses.  Pulmonary/Chest: Effort normal and breath sounds normal. No stridor. No respiratory distress. She has no wheezes. She has no rales.  Abdominal: Soft. Bowel sounds are normal. She exhibits no distension. There is tenderness in the right lower quadrant,  suprapubic area and left lower quadrant. There is no rigidity, no rebound and no guarding. No hernia.  Musculoskeletal: She exhibits no edema or tenderness.  Neurological: She is alert. She has normal strength. No cranial nerve deficit (no facial droop, extraocular movements intact, no slurred speech) or sensory deficit. She exhibits normal muscle tone. She displays no seizure activity. Coordination normal.  Skin: Skin is warm and dry. No rash noted.  Psychiatric: She has a normal mood and affect.  Nursing note and vitals reviewed.    ED Treatments / Results  Labs (all labs ordered are listed, but only abnormal results are displayed) Labs Reviewed  URINALYSIS, ROUTINE W REFLEX MICROSCOPIC - Abnormal; Notable for the following components:      Result Value   APPearance HAZY (*)    Hgb urine dipstick MODERATE (*)    Leukocytes, UA LARGE (*)    Bacteria, UA MANY (*)    Squamous Epithelial / LPF 6-30 (*)    All other components within normal limits  LIPASE, BLOOD  COMPREHENSIVE METABOLIC PANEL  CBC    Procedures Procedures (including critical care time)  Medications Ordered in ED Medications  cefTRIAXone (ROCEPHIN) 1 g in dextrose 5 % 50 mL IVPB (1 g Intravenous New Bag/Given 03/26/17 1507)  ketorolac (TORADOL) 30 MG/ML injection 15 mg (15 mg Intravenous Given 03/26/17 1427)  ondansetron (ZOFRAN) injection 4 mg (4 mg Intravenous Given 03/26/17 1427)  sodium chloride 0.9 % bolus 1,000 mL (1,000 mLs Intravenous New Bag/Given 03/26/17 1427)     Initial Impression / Assessment and Plan / ED Course  I have reviewed the triage vital signs and the nursing notes.  Pertinent labs & imaging results that were available during my care of the patient were reviewed by me and considered in my medical decision making (see chart for details).   Patient presented to the emergency room for evaluation of lower abdominal pain.  Patient was also having symptoms of feeling feverish at home.   Patient's laboratory tests are reassuring with the exception of an abnormal urinalysis.  She does have some squamous cell contamination but she does have large leukocyte esterase many bacteria and white blood cells.  I think her urinalysis is consistent with a urinary tract infection.  I suspect her objective fevers and back pain suggest an early pyelonephritis.  Patient was given a dose of IV Rocephin.  I will discharge her home on oral antibiotics.  Discussed the importance of close outpatient follow-up.  Final Clinical Impressions(s) / ED Diagnoses   Final diagnoses:  Pyelonephritis    ED Discharge Orders  Ordered    traMADol (ULTRAM) 50 MG tablet  Every 6 hours PRN     03/26/17 1533    naproxen (NAPROSYN) 500 MG tablet  2 times daily with meals     03/26/17 1533    cephALEXin (KEFLEX) 500 MG capsule  2 times daily     03/26/17 1533    ondansetron (ZOFRAN ODT) 8 MG disintegrating tablet  Every 8 hours PRN     03/26/17 1533       Linwood DibblesKnapp, Marletta Bousquet, MD 03/26/17 1538

## 2017-03-26 NOTE — Discharge Instructions (Signed)
Take the antibiotics as prescribed, follow-up with a primary care doctor in a week or so to make sure your infection and symptoms have resolved,

## 2017-03-26 NOTE — ED Notes (Signed)
Called Lab and spoke with Ed in Lab, have no results on blood work that has been drawn and urine now states it's pending

## 2017-03-26 NOTE — ED Triage Notes (Signed)
Patient c/o lower abd pain and bilateral flank pain. Per patient nausea but no vomiting or diarrhea. Patient does report intermittent fevers and pain with urination. Denies any blood in urine.  Hx of kidney stone and mesh in stomach. Patient also states twitching/spasm in left eye.

## 2017-07-03 ENCOUNTER — Other Ambulatory Visit: Payer: Self-pay

## 2017-07-03 ENCOUNTER — Emergency Department (HOSPITAL_COMMUNITY): Payer: Medicare Other

## 2017-07-03 ENCOUNTER — Encounter (HOSPITAL_COMMUNITY): Payer: Self-pay

## 2017-07-03 ENCOUNTER — Emergency Department (HOSPITAL_COMMUNITY)
Admission: EM | Admit: 2017-07-03 | Discharge: 2017-07-04 | Disposition: A | Discharge status: Home / Self Care | Payer: Medicare Other | Attending: Emergency Medicine | Admitting: Emergency Medicine

## 2017-07-03 DIAGNOSIS — R51 Headache: Secondary | ICD-10-CM | POA: Insufficient documentation

## 2017-07-03 DIAGNOSIS — I10 Essential (primary) hypertension: Secondary | ICD-10-CM | POA: Insufficient documentation

## 2017-07-03 DIAGNOSIS — F17210 Nicotine dependence, cigarettes, uncomplicated: Secondary | ICD-10-CM | POA: Diagnosis not present

## 2017-07-03 DIAGNOSIS — Z79899 Other long term (current) drug therapy: Secondary | ICD-10-CM | POA: Diagnosis not present

## 2017-07-03 DIAGNOSIS — Z87820 Personal history of traumatic brain injury: Secondary | ICD-10-CM | POA: Insufficient documentation

## 2017-07-03 DIAGNOSIS — M62838 Other muscle spasm: Secondary | ICD-10-CM | POA: Diagnosis not present

## 2017-07-03 DIAGNOSIS — R519 Headache, unspecified: Secondary | ICD-10-CM

## 2017-07-03 HISTORY — DX: Headache: R51

## 2017-07-03 HISTORY — DX: Headache, unspecified: R51.9

## 2017-07-03 LAB — CBC
HCT: 41.6 % (ref 36.0–46.0)
Hemoglobin: 13.6 g/dL (ref 12.0–15.0)
MCH: 31.3 pg (ref 26.0–34.0)
MCHC: 32.7 g/dL (ref 30.0–36.0)
MCV: 95.6 fL (ref 78.0–100.0)
PLATELETS: 218 10*3/uL (ref 150–400)
RBC: 4.35 MIL/uL (ref 3.87–5.11)
RDW: 13 % (ref 11.5–15.5)
WBC: 7.7 10*3/uL (ref 4.0–10.5)

## 2017-07-03 LAB — BASIC METABOLIC PANEL
Anion gap: 12 (ref 5–15)
BUN: 14 mg/dL (ref 6–20)
CALCIUM: 9.5 mg/dL (ref 8.9–10.3)
CO2: 23 mmol/L (ref 22–32)
Chloride: 105 mmol/L (ref 101–111)
Creatinine, Ser: 0.68 mg/dL (ref 0.44–1.00)
GFR calc Af Amer: >60 mL/min (ref >60)
GLUCOSE: 106 mg/dL — AB (ref 65–99)
Potassium: 3.4 mmol/L — ABNORMAL LOW (ref 3.5–5.1)
Sodium: 140 mmol/L (ref 135–145)

## 2017-07-03 MED ORDER — ONDANSETRON HCL 4 MG/2ML IJ SOLN
4.0000 mg | Freq: Once | INTRAMUSCULAR | Status: AC
  Administered 2017-07-04: 4 mg via INTRAVENOUS
  Filled 2017-07-03: qty 2

## 2017-07-03 MED ORDER — SODIUM CHLORIDE 0.9 % IV BOLUS
1000.0000 mL | Freq: Once | INTRAVENOUS | Status: AC
  Administered 2017-07-04: 1000 mL via INTRAVENOUS

## 2017-07-03 MED ORDER — SODIUM CHLORIDE 0.9 % IV BOLUS
500.0000 mL | Freq: Once | INTRAVENOUS | Status: AC
  Administered 2017-07-04: 500 mL via INTRAVENOUS

## 2017-07-03 MED ORDER — DEXAMETHASONE SODIUM PHOSPHATE 10 MG/ML IJ SOLN: 10.0000 mg | Freq: Once | INTRAMUSCULAR | Status: DC

## 2017-07-03 MED ORDER — KETOROLAC TROMETHAMINE 30 MG/ML IJ SOLN
30.0000 mg | Freq: Once | INTRAMUSCULAR | Status: AC
  Administered 2017-07-04: 30 mg via INTRAVENOUS
  Filled 2017-07-03: qty 1

## 2017-07-03 MED ORDER — DIPHENHYDRAMINE HCL 50 MG/ML IJ SOLN
25.0000 mg | Freq: Once | INTRAMUSCULAR | Status: AC
  Administered 2017-07-04: 25 mg via INTRAVENOUS
  Filled 2017-07-03: qty 1

## 2017-07-03 MED ORDER — METOCLOPRAMIDE HCL 5 MG/ML IJ SOLN
10.0000 mg | Freq: Once | INTRAMUSCULAR | Status: AC
  Administered 2017-07-04: 10 mg via INTRAVENOUS
  Filled 2017-07-03: qty 2

## 2017-07-03 NOTE — ED Triage Notes (Addendum)
EMS reports being called out for headache. Pt was seen [facility] yesterday for the same and was given a "headache cocktail" but woke up this morning "feeling worse". EMS reports pt requested to come here because she did not [facility].  Pt has history of TBI per pt report. Pt reports [place] just moved from [place] but has not seen neurologist or PCP yet. Pt also started c/o CP in triage and says it has been on and off for 3 days.

## 2017-07-04 MED ORDER — MAGNESIUM SULFATE 2 GM/50ML IV SOLN
2.0000 g | Freq: Once | INTRAVENOUS | Status: AC
  Administered 2017-07-04: 2 g via INTRAVENOUS
  Filled 2017-07-04: qty 50

## 2017-07-04 NOTE — ED Provider Notes (Signed)
[facility] EMERGENCY DEPARTMENT Provider Note   CSN: [id number] Arrival date & time: 07/03/17  1923  Time seen 23:10 PM   History   Chief Complaint Chief Complaint  Patient presents with  . Headache    x 1 week    HPI Alicia Avery is a 42 y.o. female.  HPI when I walk into the room patient sitting in the dark smiling, she states "I hope you have an answer for me".  Patient states she had a traumatic brain injury 5 years ago.  She states she used to have "electroshock" pains.  She states she was admitted in July 2018 and they took "10 cc of fluid off my optic nerve".  She states at that time she was having pressure pain.  She started getting pressure pain again about 4-5 days ago.  She states she feels like her left eye is twitching and feels swollen.  She complains of pain in the left side of her face.  She states her right hand tingles in her right arm feels numb for the past week.  She denies any weakness in her hand although she is right-handed.  She states the pain starts in her left temple and her left forehead behind her left eye and goes into her left face.  She also complains of pain "in my brainstem".  She states she has pain in her neck and her shoulders and she is having muscle spasms.  She states moving her head from left to right makes those pains worse.  She states these muscles feel stiff and painful.  She states she has visual changes that she states are "burning".  However she did not clarify she has some mild blurred vision and she is seeing spots.  She denies any difficulty walking.  Patient states she was seen at [facility] last night and was given a migraine cocktail which was Compazine, Benadryl, and steroids and she states she still having a headache.  She states she normally gets morphine and Zofran.  Looking at her discharge papers from [facility] from yesterday, Patient was given a prescription yesterday for a muscle relaxer which she has not  filled.  Patient told me she just moved here from [place] 4 months ago.  She told me she does not have a local doctor however then she told me she goes to the [facility] clinic and has been there 3 times.  She states her doctor in [place] told her she needed to get a new doctor however she states they both agree that she needs a VP shunt.  She states surgery scares her.  When I asked her why her doctor in [place] think she needs a new doctor because it sounds like they both agree on her treatment plan she cannot tell me why.  PCP [facility] in [place]   Past Medical History:  Diagnosis Date  . Depression   . Headache   . Hypertension    Past  . Lung nodule   . Reflex sympathetic dystrophy   . Renal disorder   . Traumatic brain injury (HCC)   Bipolar, Anxiety, Depression  There are no active problems to display for this patient.   Past Surgical History:  Procedure Laterality Date  . ABDOMINAL HYSTERECTOMY    . BRAIN SURGERY    . SHOULDER ARTHROSCOPY       OB History    Gravida  3   Para  3   Term  3  Preterm      AB      Living        SAB      TAB      Ectopic      Multiple      Live Births               Home Medications     Only chronic medication is Valilum  Prior to Admission medications   Medication Sig Start Date End Date Taking? Authorizing Provider  diazepam (VALIUM) 10 MG tablet Take 1 tablet by mouth 3 (three) times daily as needed. 03/20/17  Yes [provider]  cephALEXin (KEFLEX) 500 MG capsule Take 1 capsule (500 mg total) by mouth 2 (two) times daily. 03/26/17   Linwood Dibbles, MD  naproxen (NAPROSYN) 500 MG tablet Take 1 tablet (500 mg total) by mouth 2 (two) times daily with a meal. As needed for pain 03/26/17   Linwood Dibbles, MD  ondansetron (ZOFRAN ODT) 8 MG disintegrating tablet Take 1 tablet (8 mg total) by mouth every 8 (eight) hours as needed for nausea or vomiting. 03/26/17   Linwood Dibbles, MD   traMADol (ULTRAM) 50 MG tablet Take 1 tablet (50 mg total) by mouth every 6 (six) hours as needed. 03/26/17   Linwood Dibbles, MD    Family History Family History  Problem Relation Age of Onset  . Diabetes Mother   . Diabetes Other     Social History Social History   Tobacco Use  . Smoking status: Current Every Day Smoker    Packs/day: 0.50    Types: Cigarettes  . Smokeless tobacco: Never Used  Substance Use Topics  . Alcohol use: Yes    Comment: occasional  . Drug use: No  on disability for CRPS, Lupus, Scoliosis, "Mental" (bipolar, depression, anxiety)   Allergies   Celexa [citalopram hydrobromide]; Ciprofloxacin; Contrast media [iodinated diagnostic agents]; Metaxalone; and Propoxyphene   Review of Systems Review of Systems  All other systems reviewed and are negative.    Physical Exam Updated Vital Signs BP (!) 139/94 (BP Location: Right Arm)   Pulse 76   Temp 98.4 F (36.9 C) (Oral)   Resp 16   Ht 5\' 8"  (1.727 m)   Wt 68 kg (150 lb)   SpO2 97%   BMI 22.81 kg/m   Vital signs normal    Physical Exam  Constitutional: She is oriented to person, place, and time. She appears well-developed and well-nourished.  Non-toxic appearance. She does not appear ill. No distress.  When I enter the room patient is sitting in a darkened room smiling at me.  HENT:  Head: Normocephalic and atraumatic.  Right Ear: External ear normal.  Left Ear: External ear normal.  Nose: Nose normal. No mucosal edema or rhinorrhea.  Mouth/Throat: Oropharynx is clear and moist and mucous membranes are normal. No dental abscesses or uvula swelling.  Eyes: Pupils are equal, round, and reactive to light. Conjunctivae and EOM are normal.  Neck: Normal range of motion and full passive range of motion without pain. Neck supple.    Patient's trapezius muscles are very firm to touch.  Cardiovascular: Normal rate, regular rhythm and normal heart sounds. Exam reveals no gallop and no friction rub.   No murmur heard. Pulmonary/Chest: Effort normal and breath sounds normal. No respiratory distress. She has no wheezes. She has no rhonchi. She has no rales. She exhibits no tenderness and no crepitus.  Abdominal: Soft. Normal appearance and bowel sounds are normal.  She exhibits no distension. There is no tenderness. There is no rebound and no guarding.  Musculoskeletal: Normal range of motion. She exhibits no edema or tenderness.  Moves all extremities well.   Neurological: She is alert and oriented to person, place, and time. She has normal strength. No cranial nerve deficit.  Skin: Skin is warm, dry and intact. No rash noted. No erythema. No pallor.  Psychiatric: She has a normal mood and affect. Her speech is normal and behavior is normal. Her mood appears not anxious.  Nursing note and vitals reviewed.    ED Treatments / Results  Labs (all labs ordered are listed, but only abnormal results are displayed) Results for orders placed or performed during the hospital encounter of 07/03/17  CBC  Result Value Ref Range   WBC 7.7 4.0 - 10.5 K/uL   RBC 4.35 3.87 - 5.11 MIL/uL   Hemoglobin 13.6 12.0 - 15.0 g/dL   HCT 16.141.6 09.636.0 - 04.546.0 %   MCV 95.6 78.0 - 100.0 fL   MCH 31.3 26.0 - 34.0 pg   MCHC 32.7 30.0 - 36.0 g/dL   RDW 40.913.0 81.111.5 - 91.415.5 %   Platelets 218 150 - 400 K/uL  Basic metabolic panel  Result Value Ref Range   Sodium 140 135 - 145 mmol/L   Potassium 3.4 (L) 3.5 - 5.1 mmol/L   Chloride 105 101 - 111 mmol/L   CO2 23 22 - 32 mmol/L   Glucose, Bld 106 (H) 65 - 99 mg/dL   BUN 14 6 - 20 mg/dL   Creatinine, Ser 7.820.68 0.44 - 1.00 mg/dL   Calcium 9.5 8.9 - 95.610.3 mg/dL   GFR calc non Af Amer >60 >60 mL/min   GFR calc Af Amer >60 >60 mL/min   Anion gap 12 5 - 15   Laboratory interpretation all normal except borderline hypokalemia   EKG EKG Interpretation  Date/Time:  Monday July 03 2017 19:43:14 EDT Ventricular Rate:  72 PR Interval:  124 QRS Duration: 94 QT  Interval:  406 QTC Calculation: 444 R Axis:   90 Text Interpretation:  Normal sinus rhythm Rightward axis ST elevation, consider early repolarization, pericarditis, or injury No significant change since last tracing 12 Oct 2015 Confirmed by Devoria AlbeKnapp, Sebastiano Luecke ([id number]) on 07/04/2017 12:29:41 AM   Radiology Ct Head Wo Contrast  Result Date: 07/03/2017 CLINICAL DATA:  EMS reports being called out for headache. Pt was seen [facility] yesterday for the same and was given a "headache cocktail" but woke up this morning "feeling worse". EMS reports pt requested to come here because [facility]. Pt has history of TBI per pt report. Pt reports she just moved from [place] but has not seen neurologist or PCP yet.Pt states hx of fluid being removed from brain in aug 2018; no new injury EXAM: CT HEAD WITHOUT CONTRAST TECHNIQUE: Contiguous axial images were obtained from the base of the skull through the vertex without intravenous contrast. COMPARISON:  None. FINDINGS: Brain: No evidence of acute infarction, hemorrhage, hydrocephalus, extra-axial collection or mass lesion/mass effect. Vascular: No hyperdense vessel or unexpected calcification. Skull: Normal. Negative for fracture or focal lesion. Sinuses/Orbits: Visualized globes and orbits are within normal limits. The visualized sinuses and mastoid air cells are clear. Other: None. IMPRESSION: Normal unenhanced CT scan of the brain. Electronically Signed   By: Amie Portlandavid  Ormond M.D.   On: 07/03/2017 20:17    Procedures Procedures (including critical care time)  Medications Ordered in ED  Medications  sodium chloride 0.9 % bolus 1,000 mL (1,000 mLs Intravenous Given 07/04/17 0016)  sodium chloride 0.9 % bolus 500 mL (500 mLs Intravenous Given 07/04/17 0016)  metoCLOPramide (REGLAN) injection 10 mg (10 mg Intravenous Given 07/04/17 0016)  diphenhydrAMINE (BENADRYL) injection 25 mg (25 mg Intravenous Given 07/04/17 0020)  ondansetron (ZOFRAN) injection 4  mg (4 mg Intravenous Given 07/04/17 0020)  ketorolac (TORADOL) 30 MG/ML injection 30 mg (30 mg Intravenous Given 07/04/17 0019)  magnesium sulfate IVPB 2 g 50 mL (2 g Intravenous New Bag/Given 07/04/17 0232)     Initial Impression / Assessment and Plan / ED Course  I have reviewed the triage vital signs and the nursing notes.  Pertinent labs & imaging results that were available during my care of the patient were reviewed by me and considered in my medical decision making (see chart for details).     Patient was given IV fluids and migraine cocktail.  She does not want the steroids, she states "it burns when they gave it to me yesterday and today and breaking out".  I also discussed with her that narcotics were not indicated for headaches.  Patient was seen at [facility] November 4 for headache, also she was seen in [place] in October with pneumonia and abdominal pain.  Review of the ED visit at [facility] in November shows she was given Toradol for her headache.  Patient states she just moved here 4 months ago however she has been seen twice in the ED in July 2017 for chest pain and she was seen in December 2018 with pyelonephritis and was discharged home for treatment   2 AM nursing staff report patient states her headache is better.  She was given IV magnesium.  3:15 AM patient is soundly sleeping.  When she was awakened she states she feels better.   Review of the [place] shows patient has been getting clonazepam and most recently diazepam filled in the past 2 years, she has had 27 prescriptions filled in the past 2 years.  She has used about 7 pharmacies. These were filled in [place], many from doctors in [place].   Final Clinical Impressions(s) / ED Diagnoses   Final diagnoses:  Nonintractable episodic headache, unspecified headache type  Muscle spasms of neck    ED Discharge Orders    None      Plan discharge  Devoria Albe, MD, Concha Pyo, MD 07/04/17 [phone]

## 2017-07-04 NOTE — ED Notes (Signed)
Patient states she doesn[place] have a ride back to [place], informed her to check with registration between 8 & 9 am to see if social worker or case management could assist her in getting a ride.

## 2017-07-04 NOTE — Discharge Instructions (Addendum)
Follow [facility] at [facility] as needed. Use ice and heat for comfort on your sore muscles between your neck and shoulder.   Get the prescription for the muscle relaxer you were prescribed yesterday filled. It will help a lot with your muscle pain.

## 2017-11-18 ENCOUNTER — Emergency Department
Admit: 2017-11-18 | Disposition: A | Discharge status: Home / Self Care | Source: Home / Self Care | Attending: Emergency Medicine | Admitting: Emergency Medicine

## 2017-11-18 LAB — HX URINE DIPSTICK W/REFLEX
CASE NUMBER: 2019222000836
HX UA BILIRUBIN: NEGATIVE — NL
HX UA GLUCOSE: NEGATIVE — NL
HX UA KETONES: NEGATIVE — NL
HX UA LEUKOCYTE ESTERASE: NEGATIVE — NL
HX UA NITRITE: NEGATIVE — NL
HX UA PH: 7 — NL (ref 5.0–8.0)
HX UA PROTEIN: NEGATIVE — NL
HX UA RBC: 2 /HPF — NL (ref 0.0–2.0)
HX UA SPECIFIC GRAVITY: 1.003 — NL (ref 1.003–1.03)
HX UA SQUAMOUS EPITHELIAL: 3 /HPF — NL (ref 0.0–5.0)
HX UA UROBILINOGEN: NEGATIVE — NL
HX UA WBC: 2 /HPF — NL (ref 0.0–5.0)

## 2017-11-18 LAB — HX .AUTOMATED DIFF
CASE NUMBER: 2019222000835
HX ABSOLUTE BASO COUNT: 0.05 10*3/uL — NL (ref 0.0–0.22)
HX ABSOLUTE EOS COUNT: 0.3 10*3/uL — NL (ref 0.0–0.45)
HX ABSOLUTE LYMPHS COUNT: 2.49 10*3/uL — NL (ref 0.74–5.04)
HX ABSOLUTE MONO COUNT: 0.92 10*3/uL — NL (ref 0.0–1.34)
HX ABSOLUTE NEUTRO COUNT: 2.39 10*3/uL — NL (ref 1.48–7.95)
HX BASOPHILS: 0.8 %
HX EOSINOPHILS: 4.9 %
HX IMMATURE GRANULOCYTES: 0.3 % — NL (ref 0.0–2.0)
HX LYMPHOCYTES: 40.4 %
HX MONOCYTES: 14.9 %
HX NEUTROPHILS: 38.7 %

## 2017-11-18 LAB — HX LACTIC ACID
CASE NUMBER: 2019222000925
HX LACTIC ACID LVL: 0.6 mmol/L — NL (ref 0.4–2.0)

## 2017-11-18 LAB — HX COMPREHENSIVE METABOLIC PANEL
CASE NUMBER: 2019222000835
HX ALBUMIN LVL: 4.2 g/dL — NL (ref 3.2–5.0)
HX ALKALINE PHOSPHATASE: 102 U/L — NL (ref 30.0–117.0)
HX ALT: 23 U/L — NL (ref 6.0–55.0)
HX ANION GAP: 6 — NL (ref 3.0–11.0)
HX AST: 28 U/L — NL (ref 6.0–40.0)
HX BILIRUBIN TOTAL: 0.4 mg/dL — NL (ref 0.2–1.2)
HX BUN: 10 mg/dL — NL (ref 6.0–20.0)
HX CALCIUM LVL: 9.5 mg/dL — NL (ref 8.5–10.5)
HX CHLORIDE: 108 mmol/L — NL (ref 98.0–110.0)
HX CO2: 25 mmol/L — NL (ref 21.0–32.0)
HX CREATININE: 0.702 mg/dL — NL (ref 0.55–1.3)
HX GLUCOSE LVL: 103 mg/dL — NL (ref 70.0–110.0)
HX POTASSIUM LVL: 4.3 mmol/L — NL (ref 3.6–5.2)
HX SODIUM LVL: 139 mmol/L — NL (ref 136.0–146.0)
HX TOTAL PROTEIN: 7.6 g/dL — NL (ref 6.0–8.4)

## 2017-11-18 LAB — HX CBC W/ DIFF
CASE NUMBER: 2019222000835
HX ABSOLUTE NRBC COUNT: 0 10*3/uL
HX HCT: 42.6 % — NL (ref 36.0–47.0)
HX HGB: 13.9 g/dL — NL (ref 11.8–16.0)
HX MCH: 31.4 pg — NL (ref 26.0–34.0)
HX MCHC: 32.6 g/dL — NL (ref 31.0–37.0)
HX MCV: 96.4 fL — NL (ref 80.0–100.0)
HX MPV: 9.9 fL — NL (ref 9.4–12.4)
HX NRBC PERCENT: 0 % — NL
HX PLATELET: 263 10*3/uL — NL (ref 150.0–400.0)
HX RBC: 4.42 10*6/uL — NL (ref 3.9–5.2)
HX RDW-CV: 13.1 % — NL (ref 11.5–14.5)
HX RDW-SD: 47 fL — NL (ref 35.0–51.0)
HX WBC: 6.2 10*3/uL — NL (ref 3.7–11.2)

## 2017-11-18 LAB — HX LIPASE LEVEL
CASE NUMBER: 2019222000835
HX LIPASE LVL: 62 U/L — ABNORMAL LOW (ref 73.0–393.0)

## 2017-11-18 LAB — HX BLUE TOP TO HOLD: CASE NUMBER: 2019222000835

## 2017-11-18 LAB — HX SST GOLD TUBE TO HOLD: CASE NUMBER: 2019222000835

## 2017-11-18 LAB — HX HCG QUANTITATIVE
CASE NUMBER: 18346492
HX HCG QUANT: 2 m[IU]/mL — NL (ref 0.0–4.0)

## 2017-11-18 LAB — HX GLOMERULAR FILTRATION RATE (ESTIMATED)
CASE NUMBER: 2019222000835
HX AFN AMER GLOMERULAR FILTRATION RATE: >90
HX NON-AFN AMER GLOMERULAR FILTRATION RATE: >90

## 2018-02-02 ENCOUNTER — Emergency Department
Admit: 2018-02-02 | Disposition: A | Discharge status: Other Acute Inpatient Hospital | Source: Home / Self Care | Attending: Internal Medicine | Admitting: Internal Medicine

## 2018-02-02 LAB — HX BLUE TOP TO HOLD: CASE NUMBER: 2019298000877

## 2018-02-02 LAB — HX CBC W/ DIFF
CASE NUMBER: 2019298000759
HX ABSOLUTE NRBC COUNT: 0 10*3/uL
HX HCT: 42.9 % — NL (ref 36.0–47.0)
HX HGB: 14.1 g/dL — NL (ref 11.8–16.0)
HX MCH: 31.6 pg — NL (ref 26.0–34.0)
HX MCHC: 32.9 g/dL — NL (ref 31.0–37.0)
HX MCV: 96.2 fL — NL (ref 80.0–100.0)
HX MPV: 9.9 fL — NL (ref 9.4–12.4)
HX NRBC PERCENT: 0 % — NL
HX PLATELET: 228 10*3/uL — NL (ref 150.0–400.0)
HX RBC: 4.46 10*6/uL — NL (ref 3.9–5.2)
HX RDW-CV: 12.7 % — NL (ref 11.5–14.5)
HX RDW-SD: 45.3 fL — NL (ref 35.0–51.0)
HX WBC: 7.9 10*3/uL — NL (ref 3.7–11.2)

## 2018-02-02 LAB — HX .AUTOMATED DIFF
CASE NUMBER: 2019298000759
HX ABSOLUTE BASO COUNT: 0.05 10*3/uL — NL (ref 0.0–0.22)
HX ABSOLUTE EOS COUNT: 0.25 10*3/uL — NL (ref 0.0–0.45)
HX ABSOLUTE LYMPHS COUNT: 1.9 10*3/uL — NL (ref 0.74–5.04)
HX ABSOLUTE MONO COUNT: 0.59 10*3/uL — NL (ref 0.0–1.34)
HX ABSOLUTE NEUTRO COUNT: 5.08 10*3/uL — NL (ref 1.48–7.95)
HX BASOPHILS: 0.6 %
HX EOSINOPHILS: 3.2 %
HX IMMATURE GRANULOCYTES: 0.1 % — NL (ref 0.0–2.0)
HX LYMPHOCYTES: 24.1 %
HX MONOCYTES: 7.5 %
HX NEUTROPHILS: 64.5 %

## 2018-02-02 LAB — HX COMPREHENSIVE METABOLIC PANEL
CASE NUMBER: 2019298000759
HX ALBUMIN LVL: 4.2 g/dL — NL (ref 3.2–5.0)
HX ALKALINE PHOSPHATASE: 81 U/L — NL (ref 30.0–117.0)
HX ALT: 18 U/L — NL (ref 6.0–55.0)
HX ANION GAP: 4 — NL (ref 3.0–11.0)
HX AST: 12 U/L — NL (ref 6.0–40.0)
HX BILIRUBIN TOTAL: 0.7 mg/dL — NL (ref 0.2–1.2)
HX BUN: 18 mg/dL — NL (ref 6.0–20.0)
HX CALCIUM LVL: 9.1 mg/dL — NL (ref 8.5–10.5)
HX CHLORIDE: 108 mmol/L — NL (ref 98.0–110.0)
HX CO2: 29 mmol/L — NL (ref 21.0–32.0)
HX CREATININE: 0.829 mg/dL — NL (ref 0.55–1.3)
HX GLUCOSE LVL: 89 mg/dL — NL (ref 70.0–110.0)
HX POTASSIUM LVL: 3.7 mmol/L — NL (ref 3.6–5.2)
HX SODIUM LVL: 141 mmol/L — NL (ref 136.0–146.0)
HX TOTAL PROTEIN: 7.4 g/dL — NL (ref 6.0–8.4)

## 2018-02-02 LAB — HX LIPASE LEVEL
CASE NUMBER: 2019298000759
HX LIPASE LVL: 62 U/L — ABNORMAL LOW (ref 73.0–393.0)

## 2018-02-02 LAB — HX HCG QUALITATIVE
CASE NUMBER: 2019298000877
HX HCG QUALITATIVE: NEGATIVE — NL

## 2018-02-02 LAB — HX GLOMERULAR FILTRATION RATE (ESTIMATED)
CASE NUMBER: 2019298000759
HX AFN AMER GLOMERULAR FILTRATION RATE: >90
HX NON-AFN AMER GLOMERULAR FILTRATION RATE: 88 mL/min/{1.73_m2}

## 2018-02-02 LAB — HX SST GOLD TUBE TO HOLD: CASE NUMBER: 2019298000877

## 2018-02-02 NOTE — ED Notes (Signed)
Please click on link to see document

## 2018-02-04 LAB — HX DRUGS OF ABUSE URINE 7
CASE NUMBER: 2019300000550
HX U AMPHETAMINES SCRN: NEGATIVE — NL
HX U BARBITURATE SCRN: POSITIVE — AB
HX U BENZODIAZEPINE SCRN: POSITIVE — AB
HX U CANNABINOID SCRN: POSITIVE — AB
HX U COCAINE SCRN: NEGATIVE — NL
HX U ETHANOL INTERP: NEGATIVE — NL
HX U ETHANOL: <3 — NL (ref 0.0–49.0)
HX U OPIATE 300 SCRN: NEGATIVE — NL
HX U PH FOR DAU: 5 — NL (ref 4.5–8.0)

## 2018-02-04 LAB — HX HCG QUALITATIVE URINE
CASE NUMBER: 2019300000550
HX HCG QUAL URINE: NEGATIVE — NL

## 2019-04-21 IMAGING — CT CT HEAD W/O CM
3 series · 16 of 47 positions shown, 19 images · non-contrast
Comparison: None.

CLINICAL DATA: EMS reports being called out for headache. Pt was
seen [REDACTED] yesterday for the same and was given a "headache cocktail"
but woke up this morning "feeling worse". EMS reports pt requested
to come here because she did not want to go back to [REDACTED]. Pt has
history of TBI per pt report. Pt reports she just moved from
[place] but has not seen neurologist or PCP yet.Pt states hx
of fluid being removed from brain in November 2016; no new injury

EXAM:
CT HEAD WITHOUT CONTRAST
TECHNIQUE: Contiguous axial images were obtained from the base of the skull
through the vertex without intravenous contrast.

[Series 2: head wo · axial · 0.45mm/px · z∈[+22,+157]mm · 10 of 33 slices shown, 13 images]
[im 3/33  brain]
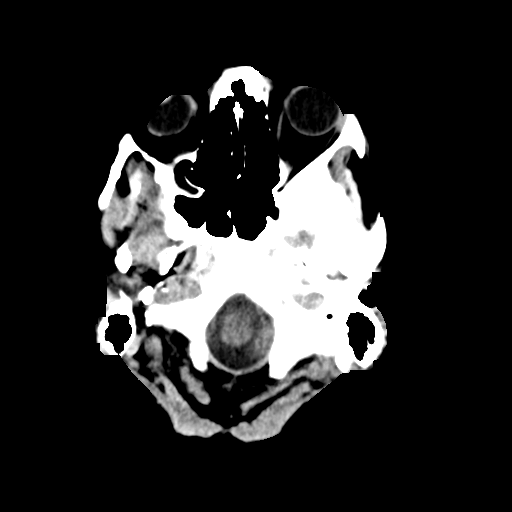
[im 3/33  bone]
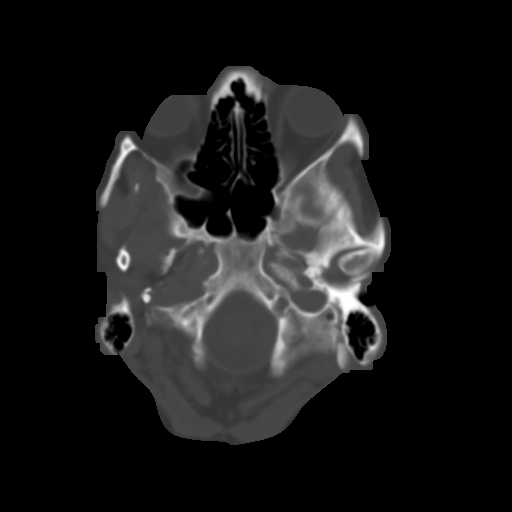
[im 6/33  brain]
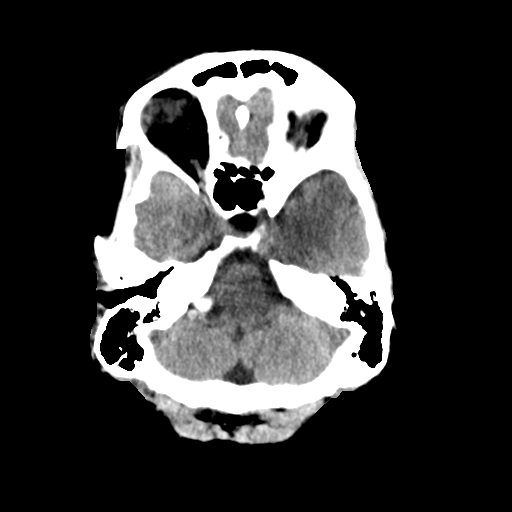
[im 9/33  brain]
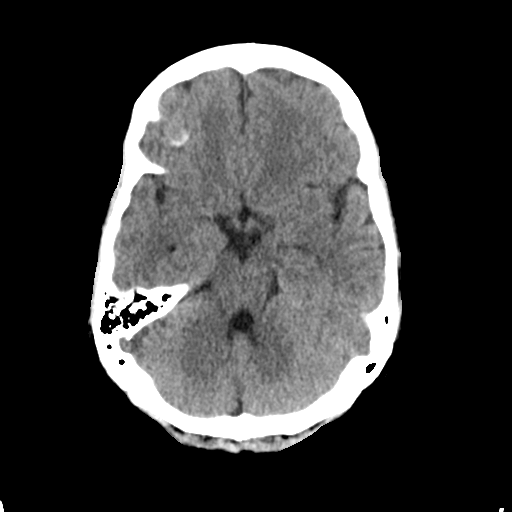
[im 12/33  brain]
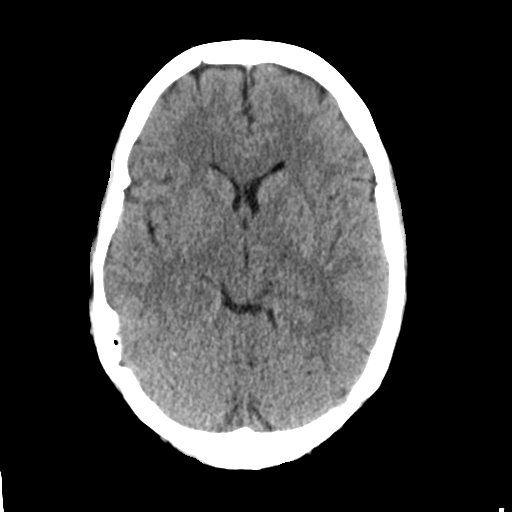
[im 15/33  brain]
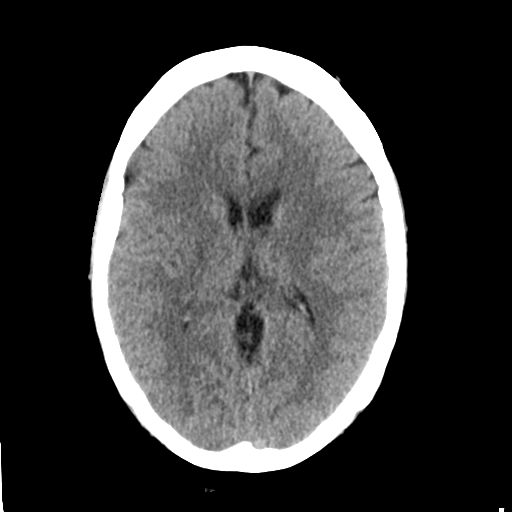
[im 15/33  bone]
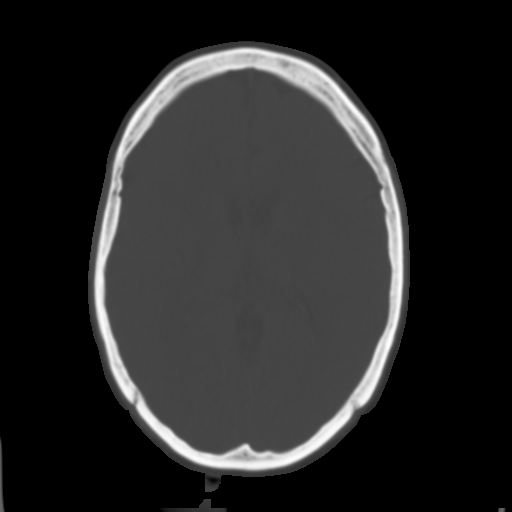
[im 18/33  brain]
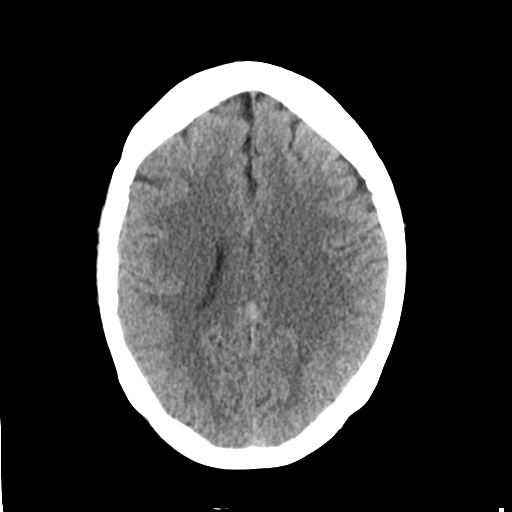
[im 21/33  brain]
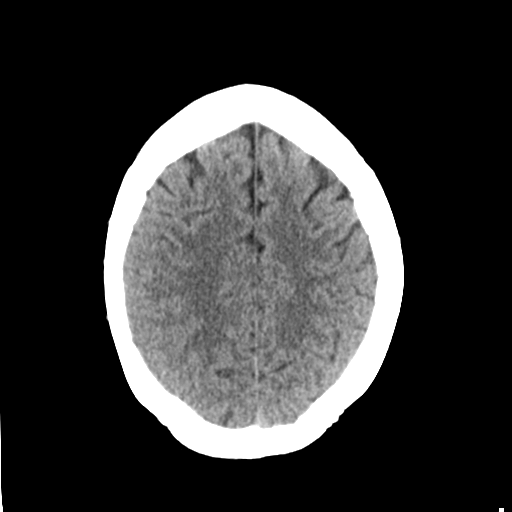
[im 25/33  brain]
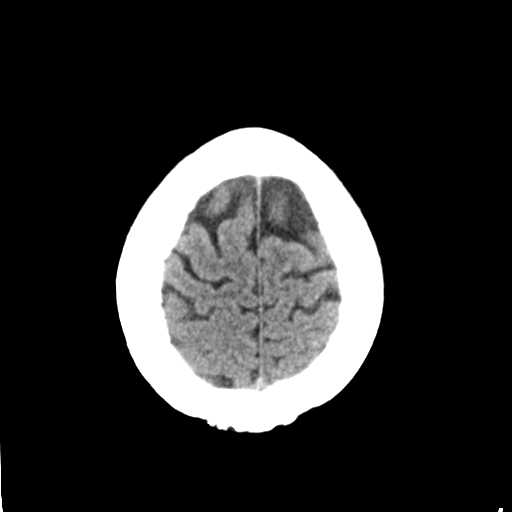
[im 27/33  brain]
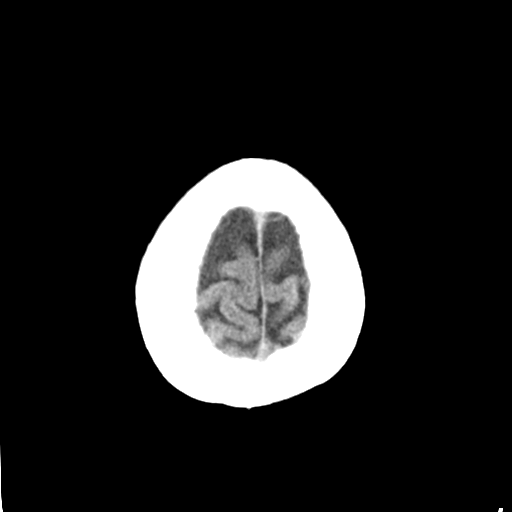
[im 27/33  bone]
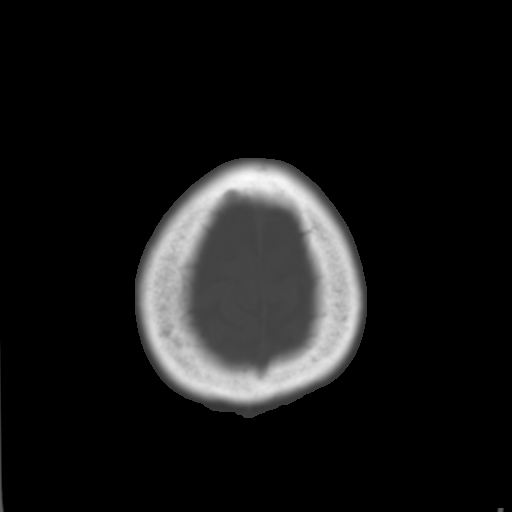
[im 30/33  brain]
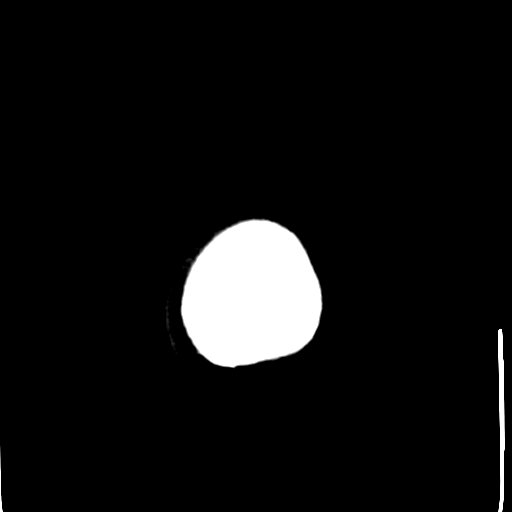

[Series 4: coronal soft tissue · coronal · 0.31mm/px · 3 of 67 slices shown]
[im 23/67  brain]
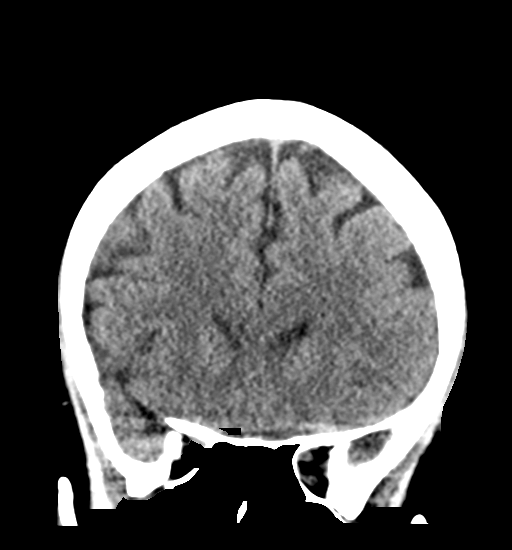
[im 30/67  brain]
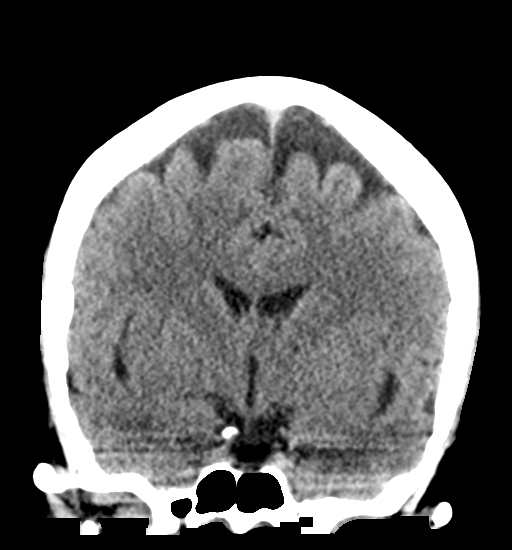
[im 37/67  brain]
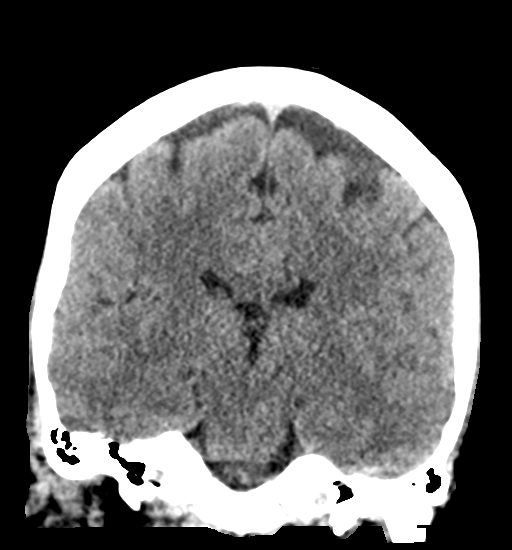

[Series 5: sagittal soft tissue · sagittal · 0.33mm/px · 3 of 52 slices shown]
[im 18/52  brain]
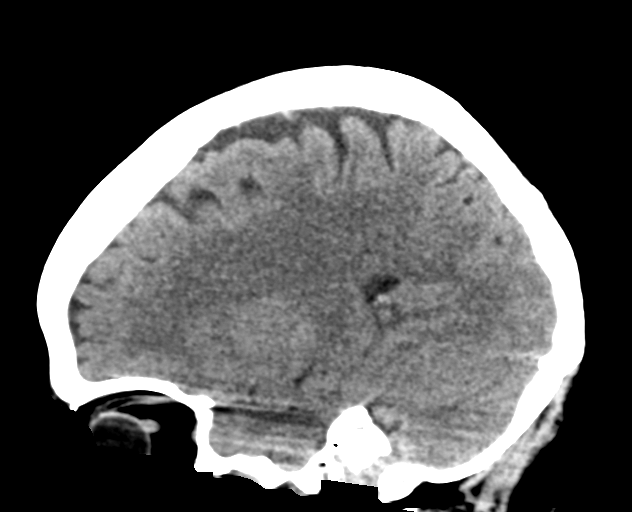
[im 26/52  brain]
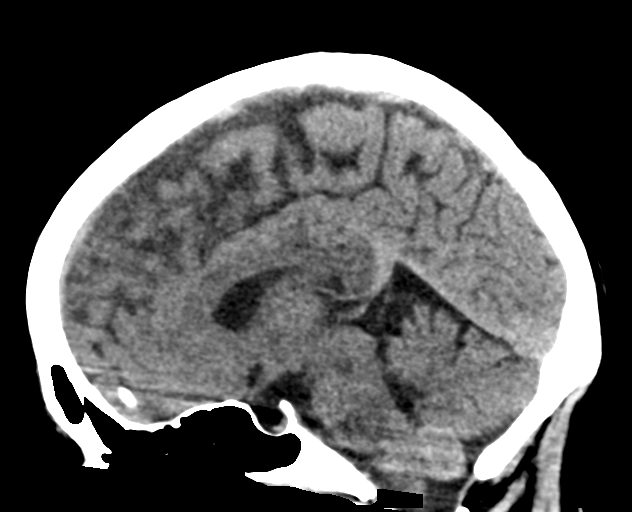
[im 35/52  brain]
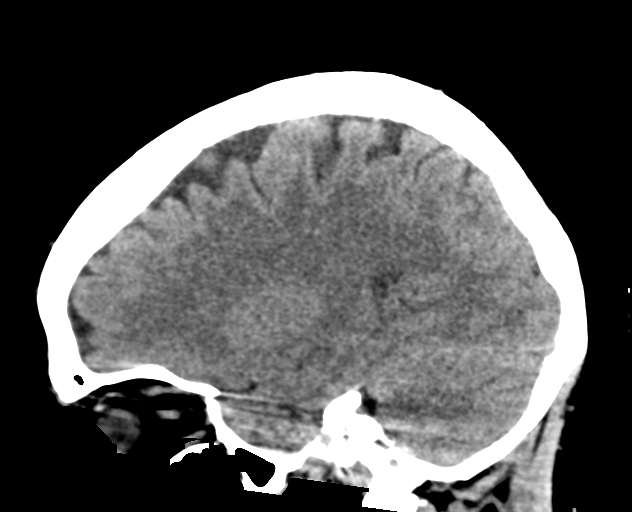

[16 of 47 positions shown; findings below may reference images not displayed]

FINDINGS: Brain: No evidence of acute infarction, hemorrhage, hydrocephalus,
extra-axial collection or mass lesion/mass effect.

Vascular: No hyperdense vessel or unexpected calcification.

Skull: Normal. Negative for fracture or focal lesion.

Sinuses/Orbits: Visualized globes and orbits are within normal
limits. The visualized sinuses and mastoid air cells are clear.

Other: None.
IMPRESSION: Normal unenhanced CT scan of the brain.

## 2019-04-26 ENCOUNTER — Emergency Department
Admit: 2019-04-26 | Disposition: A | Discharge status: Other Acute Inpatient Hospital | Source: Home / Self Care | Attending: Internal Medicine | Admitting: Internal Medicine

## 2019-04-29 LAB — HX DRUGS OF ABUSE URINE 5
CASE NUMBER: 2021015003022
HX U AMPHETAMINES SCRN: NEGATIVE — NL
HX U BENZODIAZEPINE SCRN: POSITIVE — AB
HX U COCAINE SCRN: NEGATIVE — NL
HX U ETHANOL INTERP: NEGATIVE — NL
HX U ETHANOL: <3 — NL (ref 0.0–49.0)
HX U OPIATE 300 SCRN: NEGATIVE — NL
HX U PH FOR DAU: 8 — NL (ref 4.5–8.0)

## 2019-04-29 LAB — HX URINE DIPSTICK W/REFLEX
CASE NUMBER: 2021015003022
HX UA BILIRUBIN: NEGATIVE — NL
HX UA BLOOD: NEGATIVE — NL
HX UA GLUCOSE: NEGATIVE — NL
HX UA KETONES: NEGATIVE — NL
HX UA LEUKOCYTE ESTERASE: NEGATIVE — NL
HX UA NITRITE: NEGATIVE — NL
HX UA PH: 8 — NL (ref 5.0–8.0)
HX UA PROTEIN: NEGATIVE — NL
HX UA RBC: 2 /HPF — NL (ref 0.0–2.0)
HX UA SPECIFIC GRAVITY: 1.009 — NL (ref 1.003–1.03)
HX UA SQUAMOUS EPITHELIAL: 5 /HPF — NL (ref 0.0–5.0)
HX UA UROBILINOGEN: NEGATIVE — NL
HX UA WBC: 10 /HPF — ABNORMAL HIGH (ref 0.0–5.0)

## 2019-04-29 LAB — HX SARS COV 2 RNA BY PCR
CASE NUMBER: 2021015003702
HX SARS COV 2 RNA BY PCR: NOT DETECTED

## 2019-04-29 LAB — HX HCG QUALITATIVE URINE
CASE NUMBER: 2021015003022
HX HCG QUAL URINE: NEGATIVE — NL

## 2019-08-26 ENCOUNTER — Emergency Department
Admit: 2019-08-26 | Disposition: A | Discharge status: Other Acute Inpatient Hospital | Source: Home / Self Care | Attending: Physician Assistant | Admitting: Physician Assistant

## 2019-08-26 LAB — HX CBC W/ DIFF
CASE NUMBER: 2021137004434
HX ABSOLUTE NRBC COUNT: 0 10*3/uL
HX HCT: 41 % — NL (ref 36.0–47.0)
HX HGB: 13.8 g/dL — NL (ref 11.8–16.0)
HX MCH: 32 pg — NL (ref 26.0–34.0)
HX MCHC: 33.7 g/dL — NL (ref 31.0–37.0)
HX MCV: 95.1 fL — NL (ref 80.0–100.0)
HX MPV: 9.4 fL — NL (ref 9.4–12.4)
HX NRBC PERCENT: 0 % — NL
HX PLATELET: 246 10*3/uL — NL (ref 150.0–400.0)
HX RBC: 4.31 10*6/uL — NL (ref 3.9–5.2)
HX RDW-CV: 13.2 % — NL (ref 11.5–14.5)
HX RDW-SD: 47.1 fL — NL (ref 35.0–51.0)
HX WBC: 7.1 10*3/uL — NL (ref 3.7–11.2)

## 2019-08-26 LAB — HX URINE DIPSTICK W/REFLEX
CASE NUMBER: 2021137003885
HX UA BILIRUBIN: NEGATIVE — NL
HX UA BLOOD: NEGATIVE — NL
HX UA GLUCOSE: NEGATIVE — NL
HX UA KETONES: NEGATIVE — NL
HX UA LEUKOCYTE ESTERASE: NEGATIVE — NL
HX UA NITRITE: NEGATIVE — NL
HX UA PH: 6 — NL (ref 5.0–8.0)
HX UA PROTEIN: NEGATIVE — NL
HX UA SPECIFIC GRAVITY: 1.014 — NL (ref 1.003–1.03)
HX UA UROBILINOGEN: NEGATIVE — NL

## 2019-08-26 LAB — HX DRUGS OF ABUSE URINE 9
CASE NUMBER: 2021137003885
HX U AMPHETAMINES SCRN: NEGATIVE — NL
HX U BARBITURATE SCRN: NEGATIVE — NL
HX U BENZODIAZEPINE SCRN: POSITIVE — AB
HX U CANNABINOID SCRN: NEGATIVE — NL
HX U COCAINE SCRN: NEGATIVE — NL
HX U ETHANOL INTERP: NEGATIVE — NL
HX U ETHANOL: <3 — NL (ref 0.0–49.0)
HX U METHADONE SCRN: NEGATIVE — NL
HX U OPIATE 300 SCRN: NEGATIVE — NL
HX U PH FOR DAU: 6 — NL (ref 4.5–8.0)
HX U PHENCYCLIDINE SCRN: NEGATIVE — NL

## 2019-08-26 LAB — HX .AUTOMATED DIFF
CASE NUMBER: 2021137004434
HX ABSOLUTE BASO COUNT: 0.08 10*3/uL — NL (ref 0.0–0.22)
HX ABSOLUTE EOS COUNT: 0.49 10*3/uL — ABNORMAL HIGH (ref 0.0–0.45)
HX ABSOLUTE LYMPHS COUNT: 2.9 10*3/uL — NL (ref 0.74–5.04)
HX ABSOLUTE MONO COUNT: 0.7 10*3/uL — NL (ref 0.0–1.34)
HX ABSOLUTE NEUTRO COUNT: 2.89 10*3/uL — NL (ref 1.48–7.95)
HX BASOPHILS: 1.1 %
HX EOSINOPHILS: 6.9 %
HX IMMATURE GRANULOCYTES: 0.3 % — NL (ref 0.0–2.0)
HX LYMPHOCYTES: 41 %
HX MONOCYTES: 9.9 %
HX NEUTROPHILS: 40.8 %

## 2019-08-26 LAB — HX SST GOLD TUBE TO HOLD: CASE NUMBER: 2021137004443

## 2019-08-26 LAB — HX HCG QUALITATIVE URINE
CASE NUMBER: 2021137003885
HX HCG QUAL URINE: NEGATIVE — NL

## 2019-08-26 NOTE — Psychotherapy (Signed)
Please click on link to see document

## 2019-08-27 LAB — HX COMPREHENSIVE METABOLIC PANEL
CASE NUMBER: 2021137004434
HX ALBUMIN LVL: 3.8 g/dL — NL (ref 3.2–5.0)
HX ALKALINE PHOSPHATASE: 95 U/L — NL (ref 30.0–117.0)
HX ALT: 46 U/L — NL (ref 6.0–55.0)
HX ANION GAP: 8 — NL (ref 3.0–11.0)
HX AST: 25 U/L — NL (ref 6.0–40.0)
HX BILIRUBIN TOTAL: 0.3 mg/dL — NL (ref 0.2–1.2)
HX BUN: 18 mg/dL — NL (ref 6.0–20.0)
HX CALCIUM LVL: 9 mg/dL — NL (ref 8.5–10.5)
HX CHLORIDE: 105 mmol/L — NL (ref 98.0–110.0)
HX CO2: 28 mmol/L — NL (ref 21.0–32.0)
HX CREATININE: 0.749 mg/dL — NL (ref 0.55–1.3)
HX GLUCOSE LVL: 96 mg/dL — NL (ref 70.0–110.0)
HX POTASSIUM LVL: 3.6 mmol/L — NL (ref 3.6–5.2)
HX SODIUM LVL: 141 mmol/L — NL (ref 136.0–146.0)
HX TOTAL PROTEIN: 7.1 g/dL — NL (ref 6.0–8.4)

## 2019-08-27 LAB — HX COVID19 BY PCR (LGH)
CASE NUMBER: 2021138000227
HX COVID19 BY PCR: NOT DETECTED

## 2019-08-27 LAB — HX GLOMERULAR FILTRATION RATE (ESTIMATED)
CASE NUMBER: 2021137004434
HX AFN AMER GLOMERULAR FILTRATION RATE: >90
HX NON-AFN AMER GLOMERULAR FILTRATION RATE: >90

## 2019-11-11 ENCOUNTER — Emergency Department
Admit: 2019-11-11 | Disposition: A | Discharge status: Home / Self Care | Source: Home / Self Care | Attending: Physician Assistant | Admitting: Physician Assistant

## 2019-11-13 LAB — HX WOUND CULTURE
CASE NUMBER: 2021214000307
HX F: NORMAL
HX P: NORMAL

## 2020-07-07 NOTE — Progress Notes (Signed)
* * *        **  [name]**    --- ---    21 Y old Female, DOB: May 22, 1975    [address] , [place], [place] [address]    Home: [phone]    Provider: Aletta Edouard        * * *    Telephone Encounter    ---    Answered by   Stephanie Coup  Date: 02/17/2016         Time: 11:59 AM    Caller   PT    --- ---            Reason   pt is constipated - she will call pcp            Message                      PT had stent put in on Monday afternoon and decided to leave the hospital the same day and now has nausea and pain. Cannot eat [phone]                Action Taken   [facility] 02/17/2016 11:59:15 AM > Leahy,Nancy 02/17/2016  12:36:26 PM > did you leave her with a stent? and what is the f/u plan for  her? Carinna Newhart E 02/17/2016 12:47:35 PM > No, she does not have a stent.  she passed her stone. I did a quick uretreoscopy on her and it was negative..  she has bowel issues, and I do not think this is renal colic. I need to see  her in a few weeks ( a stone analysis is pending) and she needs to go to her  primary care or back to the ED [facility] 02/17/2016 12:59:29 PM > called pt -  she is constipated again - she will call pcp                * * *                ---          * * *          Patient: [name] DOB: Oct 18, 1975 Provider: Aletta Edouard  02/17/2016    ---    Note generated by eClinicalWorks EMR/PM Software ([email or web address])

## 2020-07-07 NOTE — Progress Notes (Signed)
* * *        **  [name]**    --- ---    61 Y old Female, DOB: [birth year]    [address] , [place], [place] [address]    Home: [phone]    Provider: Aletta Edouard        * * *    Telephone Encounter    ---    Answered by   Rivka Barbara  Date: 02/19/2016         Time: 01:41 PM    Message                      still having pain.  she should coonsider er visit        --- ---                * * *                ---          * * *          Patient: [name], [name] DOB: [birth year] Provider: Aletta Edouard  02/19/2016    ---    Note generated by eClinicalWorks EMR/PM Software ([email or web address])

## 2020-07-07 NOTE — Progress Notes (Signed)
**  Progress Notes**    ---    **Patient:** [name], [name]     **Account Number:** [id number]   **Provider:** Jacie Tristan E. Roselee Nova, M.D.     **DOB:** 02/06/1976 **Age:** 46 Y **Sex:** Female   **Date:** 02/15/2016     **Phone:** [phone]     **Address:** [address] , [place], [place]     **Pcp:** MD None        * * *         **Subjective:**        ---       **Chief Complaints:**    --- ---         --- ---      **Medical History:**    --- ---        **Objective:**        ---         **Assessment:**        ---         **Plan:**        ---         --- ---          **Provider:** Reginald Weida E. Roselee Nova, M.D.    ---     **Patient:** [name], [name] **DOB:** 1975/06/03 **Date:** 02/15/2016    ---    Electronically signed by Jeani Hawking on 03/07/2016 at 03:03 PM EST    Sign off status: Completed

## 2020-07-07 NOTE — Progress Notes (Signed)
**  Progress Notes**    ---    **Patient:** [name], [name]     **Account Number:** [id number]   **Provider:** Molli Hazard A. Noel Gerold, M.D.     **DOB:** 01-Nov-1975 **Age:** 22 Y **Sex:** Female   **Date:** 02/14/2016     **Phone:** [phone]     **Address:** [address] , [place], [id number]     **Pcp:** MD None        * * *         **Subjective:**        ---       **Chief Complaints:**    --- ---         --- ---      **Medical History:**    --- ---        **Objective:**        ---         **Assessment:**        ---         **Plan:**        ---         --- ---          **Provider:** Artist Pais. Noel Gerold, M.D.    ---     **Patient:** [name], [name] **DOB:** [birth year] **Date:** 02/14/2016    ---    Electronically signed by Jeani Hawking on 03/07/2016 at 03:03 PM EST    Sign off status: Completed

## 2020-09-04 ENCOUNTER — Other Ambulatory Visit (HOSPITAL_BASED_OUTPATIENT_CLINIC_OR_DEPARTMENT_OTHER)

## 2020-09-05 ENCOUNTER — Encounter (HOSPITAL_BASED_OUTPATIENT_CLINIC_OR_DEPARTMENT_OTHER): Admitting: Emergency Medicine

## 2020-09-05 ENCOUNTER — Encounter

## 2020-09-05 ENCOUNTER — Emergency Department: Admit: 2020-09-05 | Payer: MEDICARE

## 2020-09-05 ENCOUNTER — Inpatient Hospital Stay
Admit: 2020-09-05 | Discharge: 2020-09-06 | Disposition: A | Discharge status: Home / Self Care | Payer: MEDICARE | Attending: Emergency Medicine

## 2020-09-05 ENCOUNTER — Other Ambulatory Visit

## 2020-09-05 DIAGNOSIS — R519 Headache, unspecified: Secondary | ICD-10-CM

## 2020-09-05 LAB — URINALYSIS REFLEX TO CULTURE
Bilirubin, Ur: NEGATIVE
Glucose,Ur: NEGATIVE mg/dL
Ketones, Ur: NEGATIVE mg/dL
Leukocyte Esterase, Ur: 25 WBC/uL — AB
Nitrite, Ur: NEGATIVE
Protein,Ur: NEGATIVE mg/dL
RBC, Ur: 2 /HPF (ref 0–2)
Specific Gravity, Ur: 1.008 (ref 1.003–1.030)
Squamous Epithelial Cells, Ur: 2 /HPF (ref 0–5)
Urobilinogen, Ur: NEGATIVE
WBC, Ur: 6 /HPF — ABNORMAL HIGH (ref 0–5)
pH, Ur: 5 (ref 5.0–8.0)

## 2020-09-05 LAB — URINE DRUG SCREEN LIMITED PANEL (EMERGENCY ROOM)
Amphetamines screen, urine: NEGATIVE
Benzodiazepines screen, urine: POSITIVE — AB
Cocaine screen, urine: NEGATIVE
Creatinine, urine, specimen validity: 79.9 mg/dL (ref >=20.00)
Ethanol screen, urine: NOT DETECTED
Opiates screen, urine: NEGATIVE

## 2020-09-05 LAB — COMPREHENSIVE METABOLIC PANEL
ALT: 33 U/L (ref 0–55)
AST: 39 U/L (ref 6–42)
Albumin: 4.6 g/dL (ref 3.2–5.0)
Alkaline phosphatase: 76 U/L (ref 30–130)
Anion Gap: 6 mmol/L (ref 3–14)
BUN: 9 mg/dL (ref 6–24)
Bilirubin, total: 0.7 mg/dL (ref 0.2–1.2)
CO2 (Bicarbonate): 22 mmol/L (ref 20–32)
Calcium: 10.1 mg/dL (ref 8.5–10.5)
Chloride: 109 mmol/L (ref 98–110)
Creatinine: 0.81 mg/dL (ref 0.55–1.30)
Glucose: 117 mg/dL — ABNORMAL HIGH (ref 70–110)
Potassium: 4.4 mmol/L (ref 3.6–5.2)
Protein, total: 8.4 g/dL (ref 6.0–8.4)
Sodium: 137 mmol/L (ref 135–146)
eGFRcr: 92 mL/min/{1.73_m2} (ref >=60)

## 2020-09-05 LAB — URINE DRUG SCREEN
Amphetamines screen, urine: NEGATIVE
Barbiturates screen, urine: NEGATIVE
Benzodiazepines screen, urine: POSITIVE — AB
Buprenorphine screen, urine: NEGATIVE
Cannabinoids screen, urine: POSITIVE — AB
Cocaine screen, urine: NEGATIVE
Creatinine, urine, specimen validity: 79.9 mg/dL (ref >=20.00)
Ethanol screen, urine: NOT DETECTED
Fentanyl screen, urine: NEGATIVE
Methadone screen, urine: NEGATIVE
Opiates screen, urine: NEGATIVE
Oxycodone screen, urine: NEGATIVE

## 2020-09-05 LAB — C-REACTIVE PROTEIN: CRP: <0.29 mg/dL — ABNORMAL LOW (ref 0.29–0.80)

## 2020-09-05 LAB — HCG, SERUM, QUALITATIVE: hCG Qualitative Serum: NEGATIVE

## 2020-09-05 MED ORDER — traMADol (Ultram) 50 mg tablet
50 | ORAL_TABLET | Freq: Four times a day (QID) | ORAL | 0 refills | 11.00000 days | Status: AC | PRN
Start: 2020-09-05 — End: 2020-09-08

## 2020-09-05 MED ORDER — ALPRAZolam (Xanax) tablet 1 mg
1 | Freq: Once | ORAL | Status: AC
Start: 2020-09-05 — End: 2020-09-05
  Administered 2020-09-06: 02:00:00 1 mg via ORAL

## 2020-09-05 MED ORDER — traMADol (Ultram) tablet 50 mg
50 | Freq: Once | ORAL | Status: AC
Start: 2020-09-05 — End: 2020-09-05
  Administered 2020-09-06: 01:00:00 50 mg via ORAL

## 2020-09-05 NOTE — Discharge Instructions (Addendum)
Follow-up with your primary care physician as scheduled next week.  In    Also follow-up with neurology [phone]

## 2020-09-05 NOTE — ED Triage Notes (Signed)
Speech sounds muffled per her sons report
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
## 2020-09-05 NOTE — Other (Signed)
Patient Education   Table of Contents       General Headache Without Cause     To view videos and all your education online visit,   [email or web address]   or scan this QR code with your smartphone.                    General Headache Without Cause     A headache is pain or discomfort that is felt around the head or neck area. There are many causes and types of headaches. In some cases, the cause may not be found.   Follow these instructions at home:   Watch your condition for any changes. Let your doctor know about them. Take these steps to help with your condition:   Managing pain               Take over-the-counter and prescription medicines only as told by your doctor.       Lie down in a dark, quiet room when you have a headache.      If told, put ice on your head and neck area:       Put ice in a plastic bag.       Place a towel between your skin and the bag.       Leave the ice on for 20 minutes, 2?3 times per day.      If told, put heat on the affected area. Use the heat source that your doctor recommends, such as a moist heat pack or a heating pad.       Place a towel between your skin and the heat source.       Leave the heat on for 20?30 minutes.       Remove the heat if your skin turns bright red. This is very important if you are unable to feel pain, heat, or cold. You may have a greater risk of getting burned.       Keep lights dim if bright lights bother you or make your headaches worse.       Eating and drinking         Eat meals on a regular schedule.      If you drink alcohol:      Limit how much you use to:       0?1 drink a day for women.       0?2 drinks a day for men.           Be aware of how much alcohol is in your drink. In the [place] one drink equals one 12 oz bottle of beer (355 mL), one 5 oz glass of wine (148 mL), or one 1? oz glass of hard liquor (44 mL).         Stop drinking caffeine, or reduce how much caffeine you drink.   General instructions           Keep a journal to  find out if certain things bring on headaches. For example, write down:       What you eat and drink.       How much sleep you get.       Any change to your diet or medicines.       Get a massage or try other ways to relax.       Limit stress.       Sit up straight. Do not  tighten (tense) your muscles.  Do not  use any products that contain nicotine or tobacco. This includes cigarettes, e-cigarettes, and chewing tobacco. If you need help quitting, ask your doctor.       Exercise regularly as told by your doctor.       Get enough sleep. This often means 7?9 hours of sleep each night.       Keep all follow-up visits as told by your doctor. This is important.         Contact a doctor if:         Your symptoms are not helped by medicine.       You have a headache that feels different than the other headaches.       You feel sick to your stomach (nauseous) or you throw up (vomit).       You have a fever.     Get help right away if:         Your headache gets very bad quickly.       Your headache gets worse after a lot of physical activity.       You keep throwing up.       You have a stiff neck.       You have trouble seeing.       You have trouble speaking.       You have pain in the eye or ear.       Your muscles are weak or you lose muscle control.       You lose your balance or have trouble walking.       You feel like you will pass out (faint) or you pass out.       You are mixed up (confused).       You have a seizure.     Summary         A headache is pain or discomfort that is felt around the head or neck area.       There are many causes and types of headaches. In some cases, the cause may not be found.       Keep a journal to help find out what causes your headaches. Watch your condition for any changes. Let your doctor know about them.       Contact a doctor if you have a headache that is different from usual, or if your headache is not helped by medicine.       Get help right away if your headache gets  very bad, you throw up, you have trouble seeing, you lose your balance, or you have a seizure.     This information is not intended to replace advice given to you by your health care provider. Make sure you discuss any questions you have with your health care provider.     Document Released: 09/26/2009Document Revised: 07/08/2019Document Reviewed: 10/16/2017     Elsevier Patient Education ? 2022 Elsevier Inc.

## 2020-09-05 NOTE — ED Triage Notes (Signed)
Pt has hx of TBI. Reports increased diff with balance and vision changes over the past few months. Today she feels as if she is having difficulty finding words at aprox 1642. bp 173/111. Reporting left sided head pain 8/10.

## 2020-09-06 LAB — CBC WITH DIFFERENTIAL
Basophils %: 0.3 %
Basophils Absolute: 0.03 10*3/uL (ref 0.00–0.22)
Eosinophils %: 1.4 %
Eosinophils Absolute: 0.13 10*3/uL (ref 0.00–0.50)
Hematocrit: 44.5 % (ref 32.0–47.0)
Hemoglobin: 14.9 g/dL (ref 11.0–16.0)
Immature Granulocytes %: 0.3 %
Immature Granulocytes Absolute: 0.03 10*3/uL (ref 0.00–0.10)
Lymphocyte %: 22.7 %
Lymphocytes Absolute: 2.1 10*3/uL (ref 0.70–4.00)
MCH: 32.6 pg (ref 26.0–34.0)
MCHC: 33.5 g/dL (ref 31.0–37.0)
MCV: 97.4 fL (ref 80.0–100.0)
MPV: 9.4 fL (ref 9.1–12.4)
Monocytes %: 9.5 %
Monocytes Absolute: 0.88 10*3/uL — ABNORMAL HIGH (ref 0.36–0.77)
NRBC %: 0 % (ref 0.0–0.0)
NRBC Absolute: 0 10*3/uL (ref 0.00–2.00)
Neutrophil %: 65.8 %
Neutrophils Absolute: 6.07 10*3/uL (ref 1.50–7.95)
Platelets: 282 10*3/uL (ref 150–400)
RBC: 4.57 M/uL (ref 3.70–5.20)
RDW-CV: 13.4 % (ref 11.5–14.5)
RDW-SD: 48.9 fL (ref 35.0–51.0)
WBC: 9.2 10*3/uL (ref 4.0–11.0)

## 2020-09-06 MED FILL — ALPRAZOLAM 1 MG TABLET: 1 1 mg | ORAL | Qty: 1

## 2020-09-06 MED FILL — TRAMADOL 50 MG TABLET: 50 50 mg | ORAL | Qty: 1

## 2020-09-24 ENCOUNTER — Encounter

## 2020-09-24 ENCOUNTER — Ambulatory Visit: Admit: 2020-09-24 | Discharge: 2020-09-24 | Disposition: A | Discharge status: Home / Self Care

## 2020-09-24 ENCOUNTER — Encounter: Payer: MEDICARE | Attending: Family Medicine

## 2020-09-24 ENCOUNTER — Ambulatory Visit (HOSPITAL_BASED_OUTPATIENT_CLINIC_OR_DEPARTMENT_OTHER): Admit: 2020-09-24 | Discharge: 2020-09-24 | Disposition: A | Discharge status: Home / Self Care

## 2020-09-26 ENCOUNTER — Ambulatory Visit (HOSPITAL_BASED_OUTPATIENT_CLINIC_OR_DEPARTMENT_OTHER)

## 2020-11-03 ENCOUNTER — Ambulatory Visit: Payer: MEDICARE | Attending: Neurology
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
## 2020-12-16 ENCOUNTER — Telehealth (HOSPITAL_BASED_OUTPATIENT_CLINIC_OR_DEPARTMENT_OTHER): Admitting: Neurology

## 2020-12-16 NOTE — Telephone Encounter (Addendum)
 I called Ms. [name] but she did not answer. She had an appointment with Dr. Iran Planas on 11/03/20 but it was cancelled. Appt is scheduled for 02/02/21. She has not been seen/establish care at our office yet. I left a message and let her know if she has having weakness or new neurological symptoms, she should seek immediate medical assistance at the nearest ER.    FYI Dr. Laurelyn Sickle since you are on call.

## 2020-12-16 NOTE — Telephone Encounter (Signed)
 Patient is requesting a call back in regards to her legs giving out and her hands shaking? Please contact patient Thank you.

## 2020-12-18 NOTE — Telephone Encounter (Addendum)
 Spoke to [facility] due to report of purple shiney foot.  She said she had talked with her PCP office today who told her that her RSD is spreading and that is why she has symptoms.  She also reports she has the same purple color on her shoulder.    Reports that foot is purple when dependent and returns to normal color when elevated.  Reports pain on the top of her foot that feels like there is something broken.      I advised her to be seen in ER for her symptoms and that she definitely needs to be seen in ER if her symptoms worsen or if she develops new symptoms which she stated she understood.      Dr Laurelyn Sickle forwarding to you  as FYI  since you are on call.

## 2020-12-18 NOTE — Telephone Encounter (Signed)
 Kim Manchester, MD  You; Aram Candela, NP Yesterday (10:03 AM)     TB    Agree with this plan, thanks for reaching out to patient.     Including Morrie Sheldon so we can possibly bring patient's appointment forward.     Taha    Message text           Called pt and offered her sooner apt. Scheduled with Dr. Seth Bake for 9/23. She said her foot is purple and shiny, feels broken even though it isn't. She asked if she should go to the ED, I told her if she is having new symptoms/weaknes she should. Said she did not want to but that's what the nurse(sandy left her a VM) told her to do the other day. Stayed with her son last night who watched her. Said she might go today if it does get worse.

## 2021-01-01 ENCOUNTER — Other Ambulatory Visit

## 2021-01-01 ENCOUNTER — Encounter (HOSPITAL_BASED_OUTPATIENT_CLINIC_OR_DEPARTMENT_OTHER): Admitting: Neurology

## 2021-01-01 ENCOUNTER — Encounter

## 2021-01-01 ENCOUNTER — Institutional Professional Consult (permissible substitution): Admit: 2021-01-01 | Discharge: 2021-01-01 | Payer: MEDICARE | Attending: Neurology

## 2021-01-01 VITALS — BP 127/91 | HR 109

## 2021-01-01 DIAGNOSIS — R52 Pain, unspecified: Secondary | ICD-10-CM
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
## 2021-02-02 ENCOUNTER — Ambulatory Visit: Payer: MEDICARE | Attending: Neurology

## 2021-03-11 ENCOUNTER — Ambulatory Visit: Admit: 2021-03-11 | Discharge: 2021-03-11 | Payer: MEDICARE | Attending: Neurology

## 2021-03-11 ENCOUNTER — Other Ambulatory Visit

## 2021-03-11 DIAGNOSIS — R52 Pain, unspecified: Secondary | ICD-10-CM

## 2021-03-11 NOTE — Progress Notes (Signed)
An EMG procedure was completed. Results can be found under the 'PROCEDURES' and/or 'MEDIA' sections.

## 2021-03-12 ENCOUNTER — Telehealth (HOSPITAL_BASED_OUTPATIENT_CLINIC_OR_DEPARTMENT_OTHER): Admitting: Neurology

## 2021-03-12 NOTE — Telephone Encounter (Signed)
 Good Afternoon,    Patient called and requested her EMG results.  Patient can be reached at, [phone].  Thank you.

## 2021-03-12 NOTE — Telephone Encounter (Signed)
 Called but her VM is not identified as her- can you let her know that the EMG is normal [name]?

## 2021-03-12 NOTE — Telephone Encounter (Signed)
 Patient called to find out her results for EMG. Relayed below message to her. Patients wants to knw what her next steps are       Thanks

## 2021-03-15 NOTE — Telephone Encounter (Signed)
 Called pt back, booked for phone call visit 9AM Thursday.

## 2021-03-15 NOTE — Telephone Encounter (Signed)
 Morrie Sheldon, does she prefer to have a televisit or phone call visit with me? If so, I can do this on Thurs 9 am

## 2021-03-15 NOTE — Telephone Encounter (Signed)
 Pt called and spoke to [place] last week and was told her EMG results were normal, her question now is what are the next steps because she is still having symptoms/issues. Dr Unk Lightning had mentioned sending her to the pain clinic. Pt would like a call back just to know what to do next. Pt can be reached at [phone]    Thank you

## 2021-03-18 ENCOUNTER — Encounter (HOSPITAL_BASED_OUTPATIENT_CLINIC_OR_DEPARTMENT_OTHER): Admitting: Neurology

## 2021-03-18 ENCOUNTER — Telehealth: Admit: 2021-03-18 | Discharge: 2021-03-18 | Payer: MEDICARE | Attending: Neurology

## 2021-03-18 ENCOUNTER — Telehealth (HOSPITAL_BASED_OUTPATIENT_CLINIC_OR_DEPARTMENT_OTHER): Admitting: Neurology

## 2021-03-18 DIAGNOSIS — R52 Pain, unspecified: Secondary | ICD-10-CM
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
## 2021-03-18 NOTE — Telephone Encounter (Signed)
 Hi Dr. Unk Lightning,    I received a call from the [facility] regarding the referral you sent for [facility]. They said they need you to send over the diagnosis type of pain for the patient so they can schedule her for an appointment.    - Sabrina C.

## 2021-03-18 NOTE — Telephone Encounter (Signed)
 Apologies, it is for CRPS/RSD

## 2021-04-08 ENCOUNTER — Encounter (HOSPITAL_BASED_OUTPATIENT_CLINIC_OR_DEPARTMENT_OTHER): Admitting: Pain Medicine

## 2021-04-15 ENCOUNTER — Ambulatory Visit: Payer: MEDICARE | Attending: Pain Medicine

## 2021-04-15 NOTE — Progress Notes (Deleted)
[facility]  [facility]  [address]  [place] [address]  Dept: [phone]     Patient ID: [name] is a 46 y.o. female who presents for NP - Dr. Unk Lightning - Contrast Allergy    Subjective   HPI Patient presents in the pain clinic with neck, left shoulder, low back, bilateral hips, hand and foot pain.  The pain began approximately 20 years ago following a fall on grass when she was 5 months pregnant.  She describes the pain as burning, numb and stabbing, with a crushing sensation in the feet.  Pain increases with prolonged standing and sitting.  She has done physical therapy which she found increased the pain.  Other treatment modalities include heat, icy hot, tyl#3, soma, valium and lidocaine patches.  Gabapentin caused suicidal ideations.    EMG was completed at [facility].    MRI of the lumbar and cervical spine were completed at [facility] on 09/24/2020.    EMG [facility]  {Prob/Med/Allergy/Histories/ROS (Optional):30015}    Objective   There were no vitals taken for this visit.    Physical Exam    Assessment/Plan   There are no diagnoses linked to this encounter.

## 2021-04-20 ENCOUNTER — Other Ambulatory Visit

## 2021-04-20 ENCOUNTER — Institutional Professional Consult (permissible substitution): Admit: 2021-04-20 | Discharge: 2021-04-20 | Payer: MEDICARE | Attending: Pain Medicine

## 2021-04-20 ENCOUNTER — Encounter (HOSPITAL_BASED_OUTPATIENT_CLINIC_OR_DEPARTMENT_OTHER): Admitting: Pain Medicine

## 2021-04-20 VITALS — BP 154/93 | HR 83 | Temp 98.0°F | Ht 68.0 in | Wt 145.0 lb

## 2021-04-20 DIAGNOSIS — G894 Chronic pain syndrome: Secondary | ICD-10-CM

## 2021-04-20 NOTE — Progress Notes (Signed)
[facility]  [facility]  [address]  [place] [address]  Dept: [phone]     Patient ID: [name] is a 46 y.o. female who presents for NP - Left Radicular Pain - Dr. Unk Lightning - Contrast allergy    Subjective   HPI   Patient presents in the pain clinic with neck, left shoulder, low back, bilateral hips, hand and foot pain.  The pain began approximately 20 years ago following a fall on grass when she was 5 months pregnant, which required left shoulder rotator cuff surgery. The pain then progressed to her neck, lower back, anterior thighs, B/L ankles and feet.    Her primary concerns today are the chronic pain in her left shoulder and the swelling/pain in her ankles and feet.     Left shoulder pain is constant burning/stabbing 7/10 pain and limits her ROM and activity of the arm. She describes the pain in her feet as burning, numb and stabbing, with a crushing.  Pain increases with prolonged standing and sitting.      Patient currently on disability. She has tried physical therapy years ago which was unsuccessful because she was in too much pain.        Other treatment modalities include heat, icy hot, tyl/codiene, soma, valium, medical marijuana and lidocaine patches.    Gabapentin/Lyrica caused suicidal ideations.    MRIs of the lumbar and cervical spine were completed on 09/24/2020 at [facility].    There is no problem list on file for this patient.    Current Outpatient Medications   Medication Instructions   . acetaminophen-codeine (Tylenol w/ Codeine #3) 300-30 mg tablet oral   . carisoprodol (SOMA) 350 mg, oral, Daily PRN   . diazePAM (Valium) 10 mg tablet oral   . QUEtiapine (SEROQUEL) 400 mg, oral, Nightly     Allergies   Allergen Reactions   . Propoxyphene Other, Nausea Only and Unknown   . Iodinated Contrast Media Other   . Metaxalone Hives, Swelling and Unknown   . Propoxyphene N-Acetaminophen Hives   . Ciprofloxacin      Past Medical History:   Diagnosis  Date   . Bipolar disorder (CMS/HCC)    . Headache    . Hypertension    . Lupus (CMS/HCC)     Patient reported   . PTSD (post-traumatic stress disorder)    . Reflex sympathetic dystrophy    . TBI (traumatic brain injury)      Past Surgical History:   Procedure Laterality Date   . CHOLECYSTECTOMY     . HYSTERECTOMY       No family history on file.  Social History     Tobacco Use   . Smoking status: Some Days   . Smokeless tobacco: Never   Substance Use Topics   . Alcohol use: Not Currently   . Drug use: Yes     Types: Marijuana       Objective   Visit Vitals  BP (!) 154/93 (BP Location: Right arm, Patient Position: Sitting, BP Cuff Size: Adult)   Pulse 83   Temp 36.7 C (98 F) (Temporal)   Ht 1.727 m   Wt 65.8 kg   BMI 22.05 kg/m   BSA 1.78 m       Physical Exam   Blood pressure (!) 154/93, pulse 83, temperature 36.7 C (98 F), temperature source Temporal, height 1.727 m, weight 65.8 kg.    General: Alert, oriented, not in any  acute distress    Eye: PERRL, Normal Conjunctiva    HEENT: Normocephalic, TM's clear, good light reflex, normal hearing, moist   Neck: supple, nontender, full ROM     Musculoskeletal: normal ROM, normal strength, no swelling. Decreased ROM of left shoulder with tenderness to palpation of lateral side of left upper arm. Gait normal. No obvious allodynia, vasomotor or trophic changes.    Neurologic: Alert, Oriented, normal sensory, normal motor, no focal deficits  No allodynia in her feet, some color changes consistent with venous congestion bilaterally on standing    Narrative & Impression   Procedure: MR LUMBAR SPINE WO CONTRAST  09/24/2020 6:18 AM  Indications: Radiculopathy, Lumbar Region  Comparison: CT abdomen pelvis 11/18/2017    TECHNIQUE: Multiplanar, multisequence MR imaging of the lumbar spine was performed on a 3 Tesla magnet without contrast.    Findings:    There is preservation of the lumbar lordosis.    Vertebral body heights are maintained. No evidence of an acute fracture.  Mildly heterogeneous appearance of the background bone marrow with no focal concerning osseous lesion identified on this examination.    There is mild disc height loss at L4-5 with disc desiccation. Mild disc desiccation at L3-4.    A rounded focus of fluid signal intensity at the S2 level within the central canal measures 10 mm, likely a perineural cyst.    The conus medullaris appears grossly unremarkable and terminates at the level of T12-L1.    Findings by level:    At T12-L1, there is no significant posterior disc abnormality. There is no significant central canal stenosis or neural foraminal narrowing.    At L1-2, there is no significant posterior disc abnormality. There is no significant central canal stenosis or neural foraminal narrowing.    At L2-3, minimal disc bulge. Mild bilateral facet arthropathy and ligamentum flavum hypertrophy. There is no significant central canal stenosis or neural foraminal narrowing.    At L3-4, there is no significant posterior disc abnormality. There is no significant central canal stenosis or neural foraminal narrowing.    At L4-5, mild bilateral facet arthropathy and ligamentum flavum hypertrophy. Minimal disc bulge. There is no significant central canal stenosis or neural foraminal narrowing.    At L5-S1, there is no significant posterior disc abnormality. There is no significant central canal stenosis or neural foraminal narrowing.    IMPRESSION:    Mild lumbar spine degenerative changes with no high-grade central stenosis or high-grade neural foraminal narrowing.        Cleatrice Burke, MD 09/24/2020 1:40 PM     EXAM: MRI CERVICAL SPINE WO CONTRAST    HISTORY: Cervical radiculopathy    TECHNIQUE: MRI of the cervical spine without contrast.    COMPARISON: None recent.    FINDINGS:  SURGICAL CHANGES:  None.    ALIGNMENT:   Straightening of expected cervical lordosis.    BONES:   No significant vertebral body height loss. No suspicious focal bone lesions  identified. Trace discogenic type STIR hyperintensity along the superior endplate C5. No other marrow edema at any other level.    INTERVERTEBRAL DISCS:  No significant disc height loss. No fluid signal throughout the disks. Multilevel findings as below.    FACET JOINTS:  No significant facet arthropathy at any level.    SPINAL CANAL AND SPINAL CORD:   No abnormal cervical cord signal identified. Artifact/motion on sagittal STIR. No expected fluid collections.    VISIBLE EXTRASPINAL SOFT TISSUES AND INTRACRANIAL CONTENTS:   The included  posterior fossa is normal.    EVALUATION BY LEVEL:  C1-C2: No spinal canal stenosis.    C2-C3: No spinal canal or significant neural foraminal stenosis.    C3-C4: No spinal canal or significant neural foraminal stenosis.    C4-C5: Uncovertebral joint hypertrophy and shallow broad disc osteophyte complex cause only minimal spinal canal narrowing. No signal right and mild-to-moderate left neural foraminal stenosis.    C5-C6: Mild right more so than left uncovertebral joint hypertrophy and associated broad disc osteophyte complex flattens the ventral thecal sac. Mild spinal canal narrowing. At least moderate appearing right and no more than minimal left neural foraminal narrowing.    C6-C7: Uncovertebral joint hypertrophy and associated broad disc osteophyte complex flattens the ventral thecal sac. Mild spinal canal narrowing. At least moderate right and mild-to-moderate left neural foraminal stenosis.    C7-T1: The spinal canal or significant neural foraminal stenosis.    IMPRESSION:    1.  No high-grade spinal canal stenosis. No abnormal cervical cord signal.  2.  Multilevel neural foraminal narrowing, summarized and most apparent as follows: C5-6 (at least moderate right), C6-7 (at least moderate right).    Assessment/Plan   Chronic pain syndrome  Pain  -     INT  [facility] (T PAIN)    Log staning diffuse pain complaints, with side effects and lack of  efficacy to anticonvulsants, TCA's, and SSRI's.   Has tried Lyrica and Neuronitn in the past, bioth of which caused suicidal ideation, so further anticonvulsant trials are less appealing, given this potential as a class.   Could consider reinstitution of either cymbalta or TCA's;  Is scheduled to see a psychiatrist shortly for her bipolar/depression, so would defer on this, but happy to discuss.   Did seem to hint at reinstituting prior oxycodone, but explained I did not think think this would be effective long term, so could not recommend.    Has also tried injections, without benefit.    I personally interviewed and examined the patient with Dr. Sena Hitch and agree with the history and physical findings, as well as assessment and plan as detailed in the above note in its entirety. Because this note was not dictated by myself or the resident, but some of the note content may have been put into the record by the resident via Epic, the content was checked for accuracy based on my own interactions today and I made appropriate corrections and additions before locking the note for the permanent record.
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
## 2022-03-27 LAB — CMP (EXT)
A/G Ratio (EXT): 2
ALT/SGPT (EXT): 15 U/L (ref 7–40)
AST/SGOT (EXT): 14 U/L (ref 13–40)
Albumin (EXT): 4.9 g/dL — ABNORMAL HIGH (ref 3.2–4.8)
Alkaline Phosphatase (EXT): 79 U/L (ref 46–116)
Anion Gap (EXT): 6 meq/L (ref 5–15)
BUN (EXT): 13 mg/dL (ref 9–23)
Bilirubin, Total (EXT): 1.3 mg/dL — ABNORMAL HIGH (ref 0.3–1.2)
CO2 (EXT): 26 meq/L (ref 20.0–31.0)
CalciumCalcium (EXT): 10.3 mg/dL (ref 8.7–10.4)
Chloride (EXT): 110 meq/L (ref 99–113)
Creatinine (EXT): 0.7 mg/dL (ref 0.55–1.02)
GFR Estimated (Calc)GFR Estimated (Calc) (EXT): 109 (ref >90)
Globulin (EXT): 2.5 g/dL (ref 2.3–4)
Glucose (EXT): 105 mg/dL — ABNORMAL HIGH (ref 70–99)
Potassium (EXT): 3.3 meq/L — ABNORMAL LOW (ref 3.5–5.5)
Protein (EXT): 7.4 g/dL (ref 5.7–8.2)
Sodium (EXT): 143 meq/L (ref 135–145)

## 2023-02-09 ENCOUNTER — Encounter: Payer: MEDICARE | Attending: Urology | Primary: Family Medicine

## 2023-02-09 NOTE — Telephone Encounter (Signed)
Spoke to pt and reschedule her appt out to FEB . PT is going to try to get record from her old pcp , pt stated that  Dr Elmore Guise is closed and doesn't know where to go to get her record but will find out . Advised pt to get scans done and we could go from there on. PT stated that she just want medication that she had before back in 2017

## 2023-05-24 NOTE — Telephone Encounter (Signed)
-----   Message from [place], [place] sent at 05/23/2023  2:30 PM EST -----  [name], i just spoke to pt, I was looking to obtain records she said her cc has stopped and would like to rs. Go ahead and cancel. She will call to reschedule. Thxs!

## 2023-05-26 ENCOUNTER — Encounter: Payer: MEDICAID | Attending: Urology | Primary: Family Medicine

## 2023-06-14 ENCOUNTER — Ambulatory Visit: Admit: 2023-06-14 | Discharge: 2023-06-14 | Payer: MEDICARE | Primary: Family Medicine

## 2023-06-14 DIAGNOSIS — F1120 Opioid dependence, uncomplicated: Secondary | ICD-10-CM

## 2023-06-14 NOTE — Progress Notes (Signed)
 [facility]  [address]  [place] [address]  [phone]    Patient ID: [name] is a 48 y.o. female who presents for Opioid Dependence.  [name] is seeking Medication Assisted Treatment (MAT) for addiction to:  Opioids    [name] presents to office with interested in starting: Suboxone  Pt was previously treated at [facility] - office closed.  Pt would like to continue treatment at [facility].  Currently treated with Suboxone 8-2mg  tablets TID - 24mg  daily    Today's Urine Dip:  Positive BUP, BZO, THC   06/14/23  Hcg Urine: N/A - Hysterectomy 15 years ago    HPI    PCP: Kim Brow, MD    Allergies   Allergen Reactions    Propoxyphene Other, Nausea Only and Unknown    Ultram [Tramadol]     Iodinated Contrast Media Other    Metaxalone Hives, Swelling and Unknown    Propoxyphene N-Acetaminophen Hives    Ciprofloxacin      Current Outpatient Medications   Medication Instructions    buprenorphine-naloxone (Suboxone) 8-2 mg SL tablet 1 tablet, sublingual, 3 times daily    carisoprodol (SOMA) 350 mg, oral, Daily PRN    diazePAM (Valium) 10 mg tablet oral    lamoTRIgine (LaMICtal) 100 mg tablet oral    OLANZapine ([name]) 2.5 mg tablet 1 tablet, oral, Nightly    OLANZapine ([name]) 20 mg tablet 1 tablet, oral, Nightly    prazosin (Minipress) 5 mg capsule 1 capsule, oral, Nightly    prazosin (MINIPRESS) 2 mg, oral, Nightly       DRUG USE HISTORY:  Include age first used, amount used, or currently using, frequency of use, route of administrations, last use of substance      []  heroin:    []  fentanyl: rx'd only - was on 200 mcg patch    []  oxycodone: was rx'd    []  oxycontin: was rx'd    []  benzodiazepines: rx'd currently    []  alcohol:    []  cocaine:    []  amphetamine:    []  methamphetamine:    [x]  THC: gets edibles @ dispensary; has card    [x]  tobacco: sl < 1 ppd - started age 68    []  mushrooms:    []  PCP:    []  ecstasy:    [x]  other: never abused  any street drugs or  pain meds; feels better on Suboxone rather than rx'd pain meds    SUBSTANCE ABUSE TX HX:   Include # of times, when was most recent, location, treatment length, dose (if appropriate), reason for d/c    [x]  detox program:    []  methadone maintenance:    [x]  suboxone program:    []  naltrexone (oral or injection):    []  residential (rehab, sober house, halfway house, etc):    []  drunk driver program:    MENTAL HEALTH:  Anxiety, Depression, Dipolar, PTSD, Panic Attacks    psychopathology:     psychopharmacology: [name]    psychiatric med provider: Newman Ingram, Lamictal, Prazosin, Diazepam    past psychiatric hx: (hospitalizations, self harm, SA, SI HI); suicide attempt 1 yr ago     counseling:  [name], [facility] [place] - weekly    meetings:   No    SOCIAL ISSUES:     employment: disabled    housing:  Lives alone, rents in housing     family/support: Seperated; son is schizophrenic and is homeless -  she worries about him    legal:   none    possess drivers license: no    current medical concerns: none    medical hx:  Seizure, HBP, Head Injury, CRPS left arm     surgical hx: none    stressors:       pregnancy/ lmp/ contraception: has had hyst    LABS:  date last performed/result    HIV AB    HCV AB    LFTs    HX of IVDU?  []  YES  [x]  NO  Shared needles?   []  YES  [x]  NO    Overdose?  [x]  YES  # times  1        []  NO - Suicide attempt with Seroquel  Hospital for OD?  [x]  YES  # times          []  NO  Lifetime hx of suicidal ideations per pt     LONGEST SOBER/WHEN? The past year    FAM HX SUD? Yes, Uncles    ADD'L ADDICTIVE BEHAVIORS? Eating Disorder    GOALS: stay off of pain meds - suboxone helps with her mental health    06/14/23 PMP reviewed and is appropriate.      Review of Systems   Constitutional:  Negative for malaise/fatigue.   Gastrointestinal:  Negative for diarrhea, nausea and vomiting.   Psychiatric/Behavioral:  Negative for substance abuse.             There were no vitals taken for this  visit.    Physical Exam  Neurological:      Mental Status: She is alert and oriented to person, place, and time.   Psychiatric:         Mood and Affect: Mood normal.         Behavior: Behavior normal.           ASSESSMENT/PLAN:    OUD on MAT with Suboxone  Patient meets criteria for opioid use disorder and will be considered for admittance to this program.  Risk and use of Suboxone including constipation, headaches, nausea and vomiting, sexual dysfunction and dental changes reviewed.  Medication to be dissolve under tongue or on cheek and nothing to eat or drink for 15 mins after.  Reviewed conditions for safe storage and use of Suboxone to avoid precipitated withdrawals.  Pt must safeguard medication especially from children and pets.  Reviewed the basic rules of the program and patient given copy of the policy manual.  Patient is advised that therapy is required and requested labs must be done.  Goal is to stabilize on Suboxone, continue maintenance treatment and taper off in future as patient tolerates.     Will continue on Suboxone 24 mg daily.   Does not need rx until next week    Encourage sobriety and counseling.  Support given.  Follow-up in 1 week.  Call us if any concerns.     Narcan rx provided.  Urine drug screen ordered.     Diagnosis and plan were discussed with the patient and all questions fully answered. Patient understands and is in agreement with treatment plan and recommendations. Patient was provided with ample time to ask questions.  All questions were answered to their satisfaction.  The patient indicates understanding of these issues and agrees to the plan      Issues Addressed         Codes    Uncomplicated opioid dependence (Multi-HCC)    -  Primary F11.20    Relevant Orders    Urine Drug Screen            Time spent: 20 minutes

## 2023-06-20 ENCOUNTER — Ambulatory Visit: Admit: 2023-06-20 | Discharge: 2023-06-20 | Payer: MEDICARE | Primary: Family Medicine

## 2023-06-20 DIAGNOSIS — F1120 Opioid dependence, uncomplicated: Secondary | ICD-10-CM

## 2023-06-20 MED ORDER — buprenorphine-naloxone (Suboxone) 8-2 mg SL tablet
8-2 | ORAL_TABLET | Freq: Three times a day (TID) | SUBLINGUAL | 0 refills | Status: DC
Start: 2023-06-20 — End: 2023-06-27
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
## 2023-06-26 ENCOUNTER — Ambulatory Visit: Admit: 2023-06-26 | Payer: MEDICARE | Primary: Family Medicine

## 2023-06-26 DIAGNOSIS — F1120 Opioid dependence, uncomplicated: Secondary | ICD-10-CM

## 2023-06-26 NOTE — Progress Notes (Signed)
 Case mx needs assessment deferred.     Contacted ps to schedule her counseling eval, explained requirements. Ps receptive and agreeable. Ps reports she has an appt tomorrow afternoon and has her pt1 scheduled, ideally would like eval around the same time. Scheduled for 3pm w/ Katie Marasco LCSW.    This real-time, interactive virtual Telehealth encounter was done by phone with the patient's verbal consent. The practitioner (or I) has/have the ability to do a real time audio/video encounter, however, the patient declined or unable to engage with this modality. Two patient identifiers were used and confirmed. Physical location of home to the patient was located in [place] at the time of the visit.  Physical location of the provider: office. Other participants/involvement: none  Time spent: 9 minutes

## 2023-06-27 ENCOUNTER — Ambulatory Visit: Admit: 2023-06-27 | Discharge: 2023-06-27 | Payer: MEDICARE | Primary: Family Medicine

## 2023-06-27 DIAGNOSIS — F1120 Opioid dependence, uncomplicated: Secondary | ICD-10-CM

## 2023-06-27 MED ORDER — buprenorphine-naloxone (Suboxone) 8-2 mg SL tablet
8-2 | ORAL_TABLET | Freq: Three times a day (TID) | SUBLINGUAL | 0 refills | Status: DC
Start: 2023-06-27 — End: 2023-07-11
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
## 2023-06-27 NOTE — Progress Notes (Signed)
 [facility]  [address], [address]  [place] [address]  [phone]    Patient ID: [name] is a 48 y.o. female who presents for Opioid Dependence.    SUBJECTiVE    Chief Complaint:  Opioid Use Disorder.  Patient is taking Suboxone (buprenorphine/naloxone) 24 mg/day.  Patient denies issues with medication, denies cravings for drugs, or withdrawal symptoms.    Phase of treatment: Maintenance    06/27/23  Today's issues:   Denies recovery concerns  No use/cravings/wds/se's of tx  No new interval medications/medical conditions  No new allergies  Declines tapering at this time  Wishes to continue on dose   Pt has Intake Counseling appt with Florentina Addison today    MENTAL HEALTH:  Anxiety, Depression, Dipolar, PTSD, Panic Attacks    psychopathology:     psychopharmacology: Tawni Pummel    psychiatric med provider: Newman Nip, Lamictal, Prazosin, Diazepam    past psychiatric hx: (hospitalizations, self harm, SA, SI HI); suicide attempt 1 yr ago     counseling:  Lowella Curb, [facility], [place] - weekly    meetings:   No     SOCIAL ISSUES:     employment: disabled    housing:  Lives alone, rents in housing     family/support: Seperated; son is schizophrenic and is homeless - she worries about him    legal:   none    possess drivers license: no    current medical concerns: none    medical hx:  Seizure, HBP, Head Injury, CRPS left arm     surgical hx: none    stressors:   her health    pregnancy/ lmp/ contraception: has had hyst      Review of Systems   Constitutional:  Negative for malaise/fatigue.   Gastrointestinal:  Negative for abdominal pain, constipation, diarrhea, nausea and vomiting.   Psychiatric/Behavioral:  Negative for substance abuse and suicidal ideas.         There is no problem list on file for this patient.    Current Outpatient Medications   Medication Instructions    buprenorphine-naloxone (Suboxone) 8-2 mg SL tablet 1 tablet, sublingual, 3 times daily    carisoprodol (SOMA) 350 mg,  oral, Daily PRN    diazePAM (Valium) 10 mg tablet 1 tablet, oral, 3 times daily PRN    lamoTRIgine (LaMICtal) 100 mg tablet 1.5 tablets, oral, Daily    OLANZapine (ZyPREXA) 2.5 mg tablet 1 tablet, oral, Nightly    OLANZapine (ZyPREXA) 20 mg tablet 1 tablet, oral, Nightly    prazosin (Minipress) 5 mg capsule 1 capsule, oral, Nightly    prazosin (MINIPRESS) 2 mg, oral, Nightly     Allergies   Allergen Reactions    Propoxyphene Other, Nausea Only and Unknown    Ultram [Tramadol]     Iodinated Contrast Media Other    Metaxalone Hives, Swelling and Unknown    Propoxyphene N-Acetaminophen Hives    Ciprofloxacin        OBJECTIVE    Last UDS/comment: appropriate / reviewed on this date 06/27/2023    Last oral swab/comment:    Last pill/film count:     06/27/2023 PMP reviewed and is appropriate.               Labs:  HIV AB: none on record  HCV AB: none on record  LFTS: ast 14; alt 15 (oct 2023)    Physical Exam  Neurological:      Mental Status: She is alert and oriented  to person, place, and time.   Psychiatric:         Mood and Affect: Mood normal.         Behavior: Behavior normal.         ASSESSMENT/PLAN    New Medications Ordered This Visit   Medications    buprenorphine-naloxone (Suboxone) 8-2 mg SL tablet     Sig: Place 1 tablet under the tongue three times daily for 14 days.     Dispense:  42 tablet     Refill:  0       Issues Addressed         Codes    Uncomplicated opioid dependence (Multi-HCC)    -  Primary F11.20    Relevant Medications    buprenorphine-naloxone (Suboxone) 8-2 mg SL tablet    Other Relevant Orders    Urine Drug Screen          Patient remains stable and free from illicit opioids on Suboxone (or generic) 24 mg daily.  Continue current maintenance dose. Patient continues to need ongoing care related to opioid use disorder.    Discussed the importance of Naloxone (Narcan) as a harm reduction strategy for opioid use disorder, emphasizing its role in reversing opioid overdose and recommending it for all  patients prescribed opioids. Educated the patient on signs of overdose, proper administration, and the importance of having Narcan accessible for themselves and those around them. Encouraged the patient to inform family or close contacts on its use, and reviewed prescription availability through pharmacies and community programs. Patient expressed understanding and was advised to reach out with any questions. Prescription was not sent/ patient declined      Urine drug screen ordered to monitor compliance with buprenorphine treatment and/or to evaluate for use of illicit substances.    Encouraged patient to maintain sobriety and required regular counseling.  Support given.  Follow up in 1 weeks. Call us if any concerns.    Pt aware is weekly until seen by Lindi Adie    Time spent: 6 minutes

## 2023-07-06 ENCOUNTER — Ambulatory Visit: Admit: 2023-07-06 | Payer: MEDICARE | Primary: Family Medicine

## 2023-07-06 DIAGNOSIS — F1120 Opioid dependence, uncomplicated: Secondary | ICD-10-CM

## 2023-07-06 NOTE — Progress Notes (Signed)
 [facility]  [address] [facility]  [place] [address]  [phone]     Case Management Needs Assessment    [name]   07/06/2023                                                                            [name], [place]      Identified Problem/Treatment History/Activities of Daily Living    Presenting Problem(s)/Immediate Needs (Do you or your family members need help with any urgent or presenting problems right now? If so, identify those areas below.)                                                                                                                 Denies   Current Case Management Providers (Are you working with a case manager or receiving case management elsewhere? If yes, where? What services are they providing? Are the services meeting your needs?)   Yes, has a caseworker through [name]   Case Management Provider(s) (Have you worked with a Sports coach in the past? If so, what agency? What services did they provide you? Where, and with whom?)   Yes   Daily Living/Independent Living Skills (Do you currently have a drivers's license, social security card, birth certificate, valid ID?)  If you have a license, do you also have a vehicle, insurance, proof of registration?   No, does not drive due to disability   Do you need assistance with time management?    Y / N  If yes, provide further details:  No   c. Do you need assistance with daily living skills such as communication, interaction with others, hygiene, nutrition, physical activity, recreational activities, linkage to social activities, organization?    Y / N   If yes, provide further details:   Maybe-physical activity, socializing.   d. Do you have access to technology? Equities trader, internet, house phone, cell phone, etc.)   Yes     Basic Needs/ Nutrition/Clothing    How do you access your most masic needs (clothing, food, shoes, etc.)?   Shopping   Tell me about your means to purchase food,  toiletries, and clothing?   Can afford basics, receives assistance   How do you feel about your ability to live independently?   Good     Housing/Living Situation/Environment    Tell me about your current living/housing arrangements? Who are you living with? Do you already have or need assistance paying rent?   Lives by self, low income disabled/elderly housing   How do you feel about your currently living environment (is your housing safe)? What are your housing needs (adequate furniture, in good condition, working appliances)?  No needs   Do you ever feel unsafe in your current living situation? Do you ever feel you or a family member/partner would resort to force when interacting? In the past have you ever been involved in a violent relationship (domestic violence)?   No     Transportation/Availability/Dependability/Affordability    What is your current transportation availability? Are you familiar with public transportation options (would or does this meet your needs?) Can you financially afford your current mode of transportation?   Limited-does not drive. Utilizes pt1     Financial Resources/Employment/Income/Savings    Tell me about your current financial situation? How do you currently earn income, does it meet your basic expenses?   Disability   Any serious outstanding bills? Do you need help applying for or keeping your benefits?   No   Tell me about your current jobs skills. Do you have a current resume? How do you feel about your future employment opportunities an/or what are your employment goals?   No resume, on disability     Education    Do you have a high school diploma/GED/college degree? Do you have interest in furthering your education via college courses or occupational trade? What field of study are you interested in?   HS diploma     Medical Care/Medical Status/Need for Medical CM Intervention/Non-Medical Case Management    How is your overall health right now? Are you currently experiencing  any symptoms or disabilities?   Chronic pain   Do you have any conditions that you are concerned about an/or that you are currently being treated for? How recently have you seen a medical provider(s)?   Pain   Are you able to make and get to your appointments easily? Do you need assistance getting your prescription(s) filled and/or taking your medication(s)?   Yes, gets PT1 and gets rx delivered to home     Primary Care Provider (note if applicable):     Other Medical Providers/Specialists (note if applicable):       Dental/Oral Health Care (oral health status; need for dental care)    When is the last time you saw the dentist? Are you in need of dental/oral health care (any pain or discomfort)?   No   How will you access dental/oral health care?   Call for appt     Dental Provider (note if applicable):       Mental Health (history, risk and/or treatment)    Have you ever seen a mental health counselor, received psychiatric care, been admitted to treatment? Are you currently receiving treatment for your mental/emotional health?   Yes, through [place]   When was the last time you felt depressed, anxious or sad? If applicable, how often do you feel depressed, anxious or sad?   Often depressed   Are you currently prescribed medications for depression, anxiety or other mental health concerns? Who do you speak with/reach out to when you feel down?   Yes. Speaks w/ counselor     Addictions/Substance Abuse (history, risk and/or treatment)    Have you ever used drugs, illegal substances or alcohol? In what frequency do you use these types of substances? When did you last use?   No illicit use, always prescribed for pain   Does your use of substance ever get in the way of your daily functioning (hangovers, withdrawals, and/or sickness, etc)?   N/a     Support System    Do you have positive influences in your life? Who do  you identify as your support system?   Limited- supports through [place] such as Artist    Are you participating in community support groups (if so which ones & how have they benefited you or not benefited you)? If not currently in community support groups, would you like assistance accessing such groups?   No     Legal    Are you on parole or probation? Serving any type of sentence currently (i.e., community service hours)? Any outstanding warrants, summonses, cases pending?   No     Other (needs not indicated above)       Needs Assessment Summary (key/primary areas to be addressed)   Ps is a 48 y/o female with no immediate needs. Reports actively working w/ a Artist through [place]. Reports limited supports in her personal life, estranged from most family. Does not drive but utilizes PT1 services. Gets a lot of essentials delivered-groceries, prescriptions, etc. Stable home life by self, apartment for elderly/disabled low income. Has began counseling eval w/ Katie Marasco LCSW.     Time spent: 28 minutes         [name]                          07/07/23     Patient Printed Name & Date    [place], [place]      07/07/23    Case Manager Signature & Date

## 2023-07-11 ENCOUNTER — Ambulatory Visit: Admit: 2023-07-11 | Discharge: 2023-07-11 | Payer: MEDICARE | Primary: Family Medicine

## 2023-07-11 DIAGNOSIS — F1120 Opioid dependence, uncomplicated: Secondary | ICD-10-CM

## 2023-07-11 MED ORDER — buprenorphine-naloxone (Suboxone) 8-2 mg SL tablet
8-2 | ORAL_TABLET | Freq: Three times a day (TID) | SUBLINGUAL | 0 refills | Status: DC
Start: 2023-07-11 — End: 2023-07-20
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
## 2023-07-25 ENCOUNTER — Ambulatory Visit: Admit: 2023-07-25 | Discharge: 2023-07-25 | Primary: Family Medicine

## 2023-07-25 DIAGNOSIS — F1120 Opioid dependence, uncomplicated: Secondary | ICD-10-CM

## 2023-07-25 MED ORDER — buprenorphine-naloxone (Suboxone) 8-2 mg SL tablet
8-2 | ORAL_TABLET | Freq: Three times a day (TID) | SUBLINGUAL | 0 refills | 28.00000 days | Status: DC
Start: 2023-07-25 — End: 2023-08-07

## 2023-07-25 NOTE — Progress Notes (Unsigned)
 Progress Note      Client Name: [name], [name]  D.O.B.  10-21-1975   Stressor(s)/Significant Changes in Client's Condition     ?  No Significant Change from Last Visit    ?  Mood/Affect Euthymic, Endorsed feeling "Nervous"   ?  Thought Process/Orientation Oriented x3   ?  Behavior/Functioning Congruent with mood   ?  Substance use Reports stable on MAT   Danger to:    x None ?  Self   ?  Others  ?  Property      ?  Ideation ? Plan  ? Intent   ?Attempt ? Other:   ?  Urges   ?  Cravings  Comments: Denies cravings/urges for illicit substance use   Therapeutic Intervention and Progress Toward Goal(s):    MW reported that she has been "Nervous" and that it is a feeling related to her health.  Clinician offered space for her to discuss this further.  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  She reported that paperwork is still being filed for her wheelchair.  Clinician practiced empathic listening as MW discussed how she is feeling about getting a wheelchair and her thoughts about how she could be perceived by others.  Clinician and MW discussed this further.  Clinician utilized CBT.  She reported that MAT continues to go well and discussed this positive impact Suboxone has had on her life.  She reported that it helps with pain mgmt and her mood as well.  She stated "I haven't been suicidal."  At one point, MW reported that her shoulder was shaking and noted that she is unsure if it is anxiety related or "Just the disease."  She reported that she plans on contacting her doctor to inform them of this.  Clinician offered encouragement for her to f/u with her doctor.  MW reported that her son has a court appearance this week.  She discussed her feelings about his situation.  Clinician and MW engaged in discussion about boundaries.  MW was engaged in discussion.  She returns in two weeks.  No current SI/AH/VH/HI and is agreeable to ask for help when needed.   Time spent: 49  minutes        Provider Signature/Credentials:  Molli Angelucci, LCSW   Date: 07/25/23   Referring Physician: Larance Plater, NP

## 2023-07-25 NOTE — Progress Notes (Signed)
 [facility]  [address], [address]  [place] [address]  [phone]    Patient ID: [name] is a 48 y.o. female who presents for Opioid Dependence.    SUBJECTiVE    Chief Complaint:  Opioid Use Disorder.  Patient is taking Suboxone  (buprenorphine /naloxone ) 24 mg/day.  Patient denies issues with medication, denies cravings for drugs, or withdrawal symptoms.    Phase of treatment: Maintenance    07/25/23  Today's issues:   Denies recovery concerns  No use/cravings/wds/se's of tx  Dosage changes made to lamictal and diazepam  No new allergies  Declines tapering at this time  Wishes to continue on dose, 24 mg   Pt will continue counseling with Alston Jerry, has appt. today    MENTAL HEALTH:      psychopathology: Anxiety, Depression, Bipolar, PTSD, Panic Attacks    psychopharmacology: Zyreca, Lamictal, Prazosin, Diazepam    psychiatric med provider: [name]      past psychiatric hx: (hospitalizations, self harm, SA, SI HI); suicide attempt 1 yr ago     counseling:  Jenet Miss, [facility], [place] - weekly    meetings:   No     SOCIAL ISSUES:     employment: disabled    housing:  Lives alone, rents in housing     family/support: Seperated; son is schizophrenic and is homeless - she worries about him    legal:   none    possess drivers license: no    current medical concerns: none    medical hx:  Seizure, HBP, Head Injury, CRPS left arm     surgical hx: none    stressors:   her health    pregnancy/ lmp/ contraception: has had hyst      Review of Systems   Constitutional:  Negative for malaise/fatigue.   Gastrointestinal:  Negative for abdominal pain, constipation, diarrhea, nausea and vomiting.   Psychiatric/Behavioral:  Negative for substance abuse and suicidal ideas.         There is no problem list on file for this patient.    Current Outpatient Medications   Medication Instructions    buprenorphine -naloxone  (Suboxone ) 8-2 mg SL tablet 1 tablet, sublingual, 3 times daily    carisoprodol  (SOMA) 350 mg, oral, Daily PRN    diazePAM (VALIUM) 5 mg, oral, 3 times daily    lamoTRIgine (LaMICtal) 200 mg tablet 1 tablet, oral, Daily    OLANZapine (ZyPREXA) 2.5 mg tablet 1 tablet, oral, Nightly    OLANZapine (ZyPREXA) 20 mg tablet 1 tablet, oral, Nightly    prazosin (Minipress) 5 mg capsule 1 capsule, oral, Nightly    prazosin (MINIPRESS) 2 mg, oral, Nightly     Allergies   Allergen Reactions    Propoxyphene Other, Nausea Only and Unknown    Ultram  [Tramadol ]     Iodinated Contrast Media Other    Metaxalone Hives, Swelling and Unknown    Propoxyphene N-Acetaminophen Hives    Ciprofloxacin        OBJECTIVE    Last UDS/comment: appropriate / reviewed on this date 07/25/2023    Last oral swab/comment:    Last pill/film count:     07/25/2023 PMP reviewed and is appropriate.                 Labs:  HIV AB: none on record  HCV AB: none on record  LFTS: ast 14; alt 15 (oct 2023)    Physical Exam  Neurological:      Mental Status:  She is alert and oriented to person, place, and time.   Psychiatric:         Mood and Affect: Mood normal.         Behavior: Behavior normal.         ASSESSMENT/PLAN  Patient continues to need ongoing care related to opioid use disorder.    Issues Addressed         Codes    Uncomplicated opioid dependence (Multi-HCC)    -  Primary F11.20    Relevant Medications    diazePAM (Valium) 5 mg tablet    Other Relevant Orders    Urine Drug Screen          Patient remains stable and free from illicit opioids on Suboxone (or generic) 24 mg daily.  Continue current maintenance dose.     ERX#42 Suboxone/14D    Previously discussed the importance of Naloxone (Narcan) as a harm reduction strategy for opioid use disorder, emphasizing its role in reversing opioid overdose and recommending it for all patients prescribed opioids. Educated the patient on signs of overdose, proper administration, and the importance of having Narcan accessible for themselves and those around them. Encouraged the patient to inform  family or close contacts on its use, and reviewed prescription availability through pharmacies and community programs. Patient expressed understanding and was advised to reach out with any questions. Prescription was not sent/ patient declined    Urine drug screen ordered to monitor compliance with buprenorphine treatment and/or to evaluate for use of illicit substances.    Encouraged patient to maintain sobriety and required regular counseling.  Support given.  Follow up in 2 weeks. Call us  if any concerns.      Time spent: 11 minutes

## 2023-08-08 ENCOUNTER — Ambulatory Visit: Admit: 2023-08-08 | Discharge: 2023-08-08 | Payer: MEDICARE | Primary: Family Medicine

## 2023-08-08 ENCOUNTER — Ambulatory Visit: Admit: 2023-08-08 | Discharge: 2023-08-08 | Primary: Family Medicine

## 2023-08-08 DIAGNOSIS — F1120 Opioid dependence, uncomplicated: Secondary | ICD-10-CM

## 2023-08-08 MED ORDER — buprenorphine-naloxone (Suboxone) 8-2 mg SL tablet
8-2 | ORAL_TABLET | Freq: Three times a day (TID) | SUBLINGUAL | 0 refills | 28.00000 days | Status: DC
Start: 2023-08-08 — End: 2023-08-23

## 2023-08-08 NOTE — Progress Notes (Signed)
 Progress Note      Client Name: Kim, Ingram D.O.B.  [birth year]   Stressor(s)/Significant Changes in Client's Condition     ?  No Significant Change from Last Visit    ?  Mood/Affect Anxious   ?  Thought Process/Orientation Oriented x3   ?  Behavior/Functioning Congruent with mood   ?  Substance use Reports stable on MAT   Danger to:    x None ?  Self   ?  Others  ?  Property      ?  Ideation ? Plan  ? Intent   ?Attempt ? Other:   ?  Urges   ?  Cravings  Comments: Denies cravings/urges for illicit substance use   Therapeutic Intervention and Progress Toward Goal(s):    MW endorsed that she is "Really nervous" for her son and his current situation.  Clinician offered safe space for her to discuss this further and her thoughts about her actions related to housing may have impacted him. Clinician utilized CBT.  MW was receptive to discussion. She discussed her relationship with her children further, noting that she has a "Hard time being away from my kids." Clinician and MW discussed this further.  She reported stress within her apartment complex and different issues she has had with other residents. She reported "I set boundaries" and discussed how this has been going for her. Clinician recognized MW for boundary setting. She discussed how stressful it can be at her complex. Clinician asked clarifying questions regarding housing. MW reported "I'm going to call my housing" as she reported she is considering trying to find a different place to live but is unsure of what she may have to do. Clinician offered encouragement. She returns in two weeks. No current SI/AH/VH/HI and is agreeable to ask for help when needed.   Time spent: 54 minutes        Provider Signature/Credentials:  Molli Angelucci, LCSW   Date: 08/08/23   Referring Physician: Dr. Miryousefi

## 2023-08-08 NOTE — Progress Notes (Signed)
 [facility]  [address], [address]  [place] [address]  [phone]    08/08/2023    Patient ID: [name] is a 48 y.o. female who presents for Opioid Dependence.    SUBJECTiVE    Chief Complaint:  Opioid Use Disorder.  Patient is taking Suboxone (buprenorphine/naloxone) 24 mg/day.  Patient denies issues with medication, denies cravings for drugs, or withdrawal symptoms.    Phase of treatment: Maintenance    08/08/23  Today's issues:   As of today patient denies any illicituse , cravings or side effects.  Doing well on suboxone for over a year.  Denies ETOH, she takes diazepam 5 mg TID( lowered from 10) and denies mis-use   Dosage changes made to lamictal and diazepam  No new allergies  Declines tapering at this time  Wishes to continue on dose, 24 mg   Pt will continue counseling with Alston Jerry, has appt. today    MENTAL HEALTH:      psychopathology: Anxiety, Depression, Bipolar, PTSD, Panic Attacks    psychopharmacology: Zyreca, Lamictal, Prazosin, Diazepam    psychiatric med provider: [name]      past psychiatric hx: (hospitalizations, self harm, SA, SI HI); suicide attempt 1 yr ago     counseling:  Jenet Miss, [facility], [place] - weekly    meetings:   No     SOCIAL ISSUES:     employment: disabled    housing:  Lives alone, rents in housing     family/support: Seperated; son is schizophrenic and is homeless - she worries about him    legal:   none    possess drivers license: no    current medical concerns: none    medical hx:  Seizure, HBP, Head Injury, CRPS left arm     surgical hx: none    stressors:   her health    pregnancy/ lmp/ contraception: has had hyst      Review of Systems   Constitutional:  Negative for malaise/fatigue.   Gastrointestinal:  Negative for abdominal pain, constipation, diarrhea, nausea and vomiting.   Psychiatric/Behavioral:  Negative for substance abuse and suicidal ideas.         There is no problem list on file for this patient.    Current Outpatient  Medications   Medication Instructions    buprenorphine-naloxone (Suboxone) 8-2 mg SL tablet 1 tablet, sublingual, 3 times daily    carisoprodol (SOMA) 350 mg, oral, Daily PRN    diazePAM (VALIUM) 5 mg, oral, 3 times daily    lamoTRIgine (LaMICtal) 200 mg tablet 1 tablet, oral, Daily    OLANZapine (ZyPREXA) 2.5 mg tablet 1 tablet, oral, Nightly    OLANZapine (ZyPREXA) 20 mg tablet 1 tablet, oral, Nightly    prazosin (Minipress) 5 mg capsule 1 capsule, oral, Nightly    prazosin (MINIPRESS) 2 mg, oral, Nightly     Allergies   Allergen Reactions    Propoxyphene Other, Nausea Only and Unknown    Ultram [Tramadol]     Iodinated Contrast Media Other    Metaxalone Hives, Swelling and Unknown    Propoxyphene N-Acetaminophen Hives    Ciprofloxacin        OBJECTIVE    Last UDS/comment: appropriate / reviewed on this date 08/08/2023    Last oral swab/comment:    Last pill/film count:     08/08/2023 PMP reviewed and is appropriate.                 Labs:  HIV AB: none on record  HCV AB: none on record  LFTS: ast 14; alt 15 (oct 2023)    PHYSICAL EXAM   T=  No VS today   ROS x 10:  All negative   Gen: A/Ox 3    HEENT: NC- AT-PERRLA  Neck: Supple, NL ROM   PULMONARY::  CARDIAC:  Ext: No C/C/E    Neuro: CN II-XII grossly intact. STR 5/5 times 4- Gait NL. No tremor.    MS: Mood depressed - Affect: flat Attention: Nl       ASSESSMENT/PLAN  Patient continues to need ongoing care related to opioid use disorder.    Issues Addressed         Codes      Uncomplicated opioid dependence (Multi-HCC)    -  Primary F11.20    Relevant Orders    Urine Drug Screen          Patient remains stable and free from illicit opioids on Suboxone (or generic) 24 mg daily.  Continue current maintenance dose.     ERX#42 Suboxone/14D Sent on 08/08/23     Previously discussed the importance of Naloxone (Narcan) as a harm reduction strategy for opioid use disorder, emphasizing its role in reversing opioid overdose and recommending it for all patients prescribed  opioids. Educated the patient on signs of overdose, proper administration, and the importance of having Narcan accessible for themselves and those around them. Encouraged the patient to inform family or close contacts on its use, and reviewed prescription availability through pharmacies and community programs. Patient expressed understanding and was advised to reach out with any questions. Prescription was not sent/ patient declined    Urine drug screen ordered to monitor compliance with buprenorphine treatment and/or to evaluate for use of illicit substances.    Encouraged patient to maintain sobriety and required regular counseling.  Support given.  Follow up in 2 weeks. Call us  if any concerns.      2-  TOBACCO DEPENDENCE:  The risks of smoking  & smoking cessation was  D/W patient for 5 minutes.  In pre-contemplation state       Biweekly visits are medically necessary due to relapsing remitting nature of the disease.   Time spent with the patient 10 minutes > 50 % face to face counseling

## 2023-08-21 ENCOUNTER — Encounter: Payer: MEDICARE | Primary: Family Medicine

## 2023-08-23 ENCOUNTER — Ambulatory Visit
Admit: 2023-08-23 | Discharge: 2023-08-23 | Payer: MEDICARE | Attending: Obstetrics & Gynecology | Primary: Family Medicine

## 2023-08-23 ENCOUNTER — Encounter: Payer: MEDICARE | Primary: Family Medicine

## 2023-08-23 DIAGNOSIS — F1120 Opioid dependence, uncomplicated: Secondary | ICD-10-CM

## 2023-08-23 MED ORDER — naloxone (Narcan) 4 mg/0.1 mL nasal spray
4 | NASAL | 1 refills | 2.00000 days | Status: AC | PRN
Start: 2023-08-23 — End: ?

## 2023-08-23 MED ORDER — buprenorphine-naloxone (Suboxone) 8-2 mg SL tablet
8-2 | ORAL_TABLET | Freq: Three times a day (TID) | SUBLINGUAL | 0 refills | 28.00000 days | Status: DC
Start: 2023-08-23 — End: 2023-09-05

## 2023-08-23 NOTE — Progress Notes (Signed)
 [facility]  [address]  [place] [address]  [phone]    08/23/2023    Patient ID: [name] is a 48 y.o. female who presents for Opioid Dependence.    SUBJECTiVE    Chief Complaint:  Opioid Use Disorder.  Patient is taking Suboxone (buprenorphine/naloxone) 24 mg/day tablets.  Patient denies issues with medication, denies cravings for drugs, or withdrawal symptoms.    Phase of treatment: Maintenance    08/23/23 - Coverage by Dr. 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  Pt has previously had injections, didn't really work.  States will be getting a motorized wheelchair next week 08/31/23.  Lives in a handicap accessible apartment.    As of today patient denies any illicituse , cravings or side effects.  Doing well on suboxone for over a year.  Denies ETOH, she takes diazepam 5 mg TID( lowered from 10) and denies mis-use   No new allergies  Declines tapering at this time  Wishes to continue on dose, 24 mg   Pt will continue counseling with Katie  Narcan today.     MENTAL HEALTH:      psychopathology: Anxiety, Depression, Bipolar, PTSD, Panic Attacks    psychopharmacology: Zyreca, Lamictal, Prazosin, Diazepam    psychiatric med provider: [name]      past psychiatric hx: (hospitalizations, self harm, SA, SI HI); suicide attempt 1 yr ago     counseling:  Jenet Miss, [facility], [place] - weekly    meetings:   No     SOCIAL ISSUES:     employment: disabled    housing:  Lives alone, rents in housing     family/support: Seperated; son is schizophrenic and is homeless - she worries about him    legal:   none    possess drivers license: no    current medical concerns: none    medical hx:  Seizure, HBP, Head Injury, CRPS left arm     surgical hx: none    stressors:   her health    pregnancy/ lmp/ contraception: has had hyst      Review of Systems   Constitutional:  Negative for malaise/fatigue.   Gastrointestinal:   Negative for abdominal pain, constipation, diarrhea, nausea and vomiting.   Psychiatric/Behavioral:  Negative for substance abuse and suicidal ideas.         There is no problem list on file for this patient.    Current Outpatient Medications   Medication Instructions    carisoprodol (SOMA) 350 mg, oral, Daily PRN    diazePAM (VALIUM) 5 mg, oral, 3 times daily    lamoTRIgine (LaMICtal) 200 mg tablet 1 tablet, oral, Daily    OLANZapine (ZyPREXA) 2.5 mg tablet 1 tablet, oral, Nightly    OLANZapine (ZyPREXA) 20 mg tablet 1 tablet, oral, Nightly    prazosin (Minipress) 5 mg capsule 1 capsule, oral, Nightly    prazosin (MINIPRESS) 2 mg, oral, Nightly     Allergies   Allergen Reactions    Propoxyphene Other, Nausea Only and Unknown    Ultram [Tramadol]     Iodinated Contrast Media Other    Metaxalone Hives, Swelling and Unknown    Propoxyphene N-Acetaminophen Hives    Ciprofloxacin        OBJECTIVE    Last UDS/comment: appropriate / reviewed on this date 08/23/2023    Last oral swab/comment:    Last pill/film count:  08/23/2023 PMP reviewed and is appropriate.                 Labs:  HIV AB: none on record  HCV AB: none on record  LFTS: ast 14; alt 15 (oct 2023)    PHYSICAL EXAM   T=  No VS today   ROS x 10:  All negative   Gen: A/Ox 3    HEENT: NC- AT-PERRLA  Neck: Supple, NL ROM   PULMONARY::  CARDIAC:  Ext: No C/C/E    Neuro: CN II-XII grossly intact. STR 5/5 times 4- Gait NL. No tremor.    MS: Mood depressed - Affect: flat Attention: Nl       ASSESSMENT/PLAN  Patient continues to need ongoing care related to opioid use disorder.    Issues Addressed         Codes      Uncomplicated opioid dependence (Multi-HCC)    -  Primary F11.20    Relevant Orders    Urine Drug Screen          Patient remains stable and free from illicit opioids on Suboxone (or generic) 24 mg daily.  Continue current maintenance dose.   ERX#42 Suboxone/14D     Previously discussed the importance of Naloxone (Narcan) as a harm reduction strategy for  opioid use disorder, emphasizing its role in reversing opioid overdose and recommending it for all patients prescribed opioids. Educated the patient on signs of overdose, proper administration, and the importance of having Narcan accessible for themselves and those around them. Encouraged the patient to inform family or close contacts on its use, and reviewed prescription availability through pharmacies and community programs. Patient expressed understanding and was advised to reach out with any questions. Prescription was not sent/ patient declined    Urine drug screen ordered to monitor compliance with buprenorphine treatment and/or to evaluate for use of illicit substances.    Encouraged patient to maintain sobriety and required regular counseling.  Support given.  Follow up in 2 weeks. Call us  if any concerns.      2-  TOBACCO DEPENDENCE:  The risks of smoking  & smoking cessation was  D/W patient for 5 minutes.  In pre-contemplation state       Biweekly visits are medically necessary due to relapsing remitting nature of the disease.     Time spent: 10 minutes

## 2023-08-30 ENCOUNTER — Encounter: Admit: 2023-08-30 | Discharge: 2023-08-30 | Payer: MEDICARE | Primary: Family Medicine

## 2023-08-30 DIAGNOSIS — F1120 Opioid dependence, uncomplicated: Secondary | ICD-10-CM

## 2023-08-30 NOTE — Progress Notes (Unsigned)
 Progress Note      Client Name: [name], [name] D.O.B.  [birth year]   Stressor(s)/Significant Changes in Client's Condition     ?  No Significant Change from Last Visit    ?  Mood/Affect Euthymic   ?  Thought Process/Orientation Oriented x3   ?  Behavior/Functioning Cooperative   ?  Substance use Reports stable on MAT   Danger to:    x None ?  Self   ?  Others  ?  Property      ?  Ideation ? Plan  ? Intent   ?Attempt ? Other:   ?  Urges   ?  Cravings  Comments: Denies cravings/urges for illicit substance use    Therapeutic Intervention and Progress Toward Goal(s):    Some difficulty by MW with signing on b/c of an issue w/the Zoom link she requested via email. Was eventually able to connect for session. She endorsed things are "Going okay." Clinician offered safe space for her to discuss her feelings about her son's incarceration and recent disciplinary action taken against him. She engaged in discussion with clinician about her financial support of him while he is there. Discussion led to further discussion about enabling vs support and how she may be able to mediate this for herself so she feels comfortable with what she is doing for her son and herself. She reported she wants to be there for her other children as well, noting she wants to "Make amends" for the past. Remains "Blocked" by daughter "I still don't know what I did." Clinician offered reflexive listening. Utilized CBT. She discussed her family's dynamic and how it has changed over the years. Clinician offered support for her to be mindful of what she is doing now and how she is respecting her children's boundaries while leaving the door open for them to contact her if they wish. She stated "I'm just worried" about her son who is incarcerated. She informed clinician "I'm getting my wheelchair tomorrow." Continues to deal with chronic pain that seems to be worsening. Reviewed support system. Plans to go to [facility] meetings. Clinician offered encouragement.  Clinician inquired about the possiblility of family therapy. She statd "I do want family therapy" but identifed barriers that may make this difficult/not possible at this time. She returns as in person session in one week. No current SI/AH/VH/HI and is agreeable to ask for help when needed. This real-time, interactive virtual Telehealth encounter was done by video with the patient's verbal consent. Two patient identifiers were used and confirmed. Physical location of home to the patient was located inat the time of the visit.  Physical location of the provider: office. Other participants/involvement: none  Time spent: 59 minutes          Provider Signature/Credentials:  Molli Angelucci, LCSW   Date: 08/30/23   Referring Physician: Dr. Oleh Berliner

## 2023-09-06 ENCOUNTER — Ambulatory Visit
Admit: 2023-09-06 | Discharge: 2023-09-06 | Payer: MEDICARE | Attending: Obstetrics & Gynecology | Primary: Family Medicine

## 2023-09-06 ENCOUNTER — Ambulatory Visit: Admit: 2023-09-06 | Discharge: 2023-09-06 | Payer: MEDICARE | Primary: Family Medicine

## 2023-09-06 DIAGNOSIS — F1120 Opioid dependence, uncomplicated: Secondary | ICD-10-CM

## 2023-09-06 MED ORDER — buprenorphine-naloxone (Suboxone) 8-2 mg SL tablet
8-2 | ORAL_TABLET | Freq: Three times a day (TID) | SUBLINGUAL | 0 refills | 28.00000 days | Status: DC
Start: 2023-09-06 — End: 2023-09-20

## 2023-09-06 NOTE — Progress Notes (Signed)
 Progress Note      Client Name: Mirabel, Ahlgren D.O.B.  October 31, 1975   Stressor(s)/Significant Changes in Client's Condition     ?  No Significant Change from Last Visit    ?  Mood/Affect Euthymic   ?  Thought Process/Orientation Oriented x3   ?  Behavior/Functioning Congruent with mood   ?  Substance use Reports stable on MAT, MW discussed THC use-see note   Danger to:    x None ?  Self   ?  Others  ?  Property      ?  Ideation ? Plan  ? Intent   ?Attempt ? Other:   ?  Urges   ?  Cravings  Comments: Denies cravings/urges for illicit substance use    Therapeutic Intervention and Progress Toward Goal(s):    MW reported that she did receive her new wheelchair last week as expected. Clinician offered space for her to discuss this and how she is handling change of having/using it. MW reported that she is "Getting used to it" and discussed specific times she has had some difficulty further. MW endorsed being "Very forgetful lately." She noted that she is wondering if it is partially because of an increase of her "Lamictal" rx. She reported that she has an appointment with her provider on "Friday" and plans to discuss this with her provider. Clinician offered encouragement. MW reported that she sometimes uses THC for pain relief. She reported that she has a medical card and purchases from a dispensary. She reported that this is something she mentions to all of her providers. Clinician practiced empathic listening as MW reported "That scares me" about her son who started illicitly using Suboxone while incarcerated. She reported it makes her nervous if he cannot get Suboxone that he will start using other substances. Clinician validated her feelings. MW expressed that she wishes she could help her son more and reviewed the barriers she is facing as he is incarcerated. Utilized CBT. Clinician and MW discussed focusing on what she can control in this situation. She was receptive to discussion. Discussed tx plan with MW in relation  to her goals she has identified. She returns in two weeks. Most recent UDS reviewed. No current SI/AH/VH/HI and is agreeable to ask for help when needed.   Time spent: 59 minutes        Provider Signature/Credentials:  Molli Angelucci, LCSW   Date: 09/06/23   Referring Physician: Dr. Oleh Berliner

## 2023-09-06 NOTE — Progress Notes (Signed)
 [facility]  [address], [address]  [place] [address]  [phone]      Patient ID: [name] is a 48 y.o. female who presents for Opioid Dependence.    SUBJECTiVE    Chief Complaint:  Opioid Use Disorder.  Patient is taking Suboxone (buprenorphine/naloxone) 24 mg/day tablets.  Patient denies issues with medication, denies cravings for drugs, or withdrawal symptoms.    Phase of treatment: Maintenance    09/06/23 - Coverage by Dr. 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  Pt has previously had injections, didn't really work.  Has gotten motorized wheelchair - needs assistance up stairs.  Though she likes the [place] office, discussed changing facilities to [facility] office which has ramp.  She discussed a worry about getting into a van in her wheelchair, afraid she might fall off. Would think safeguards are in place - should discuss with provider so the pt doesn't limit her options.   Patient does live in a handicap accessible apartment.    As of today patient denies any illicituse , cravings or side effects.  Doing well on suboxone for over a year.  Denies ETOH, she takes diazepam 5 mg TID( lowered from 10) and denies mis-use   No new allergies  Declines tapering at this time  Wishes to continue on dose, buprenorphine/nx tablets 24 mg daily.  Pt will continue counseling with Katie Marasco biweekly.       MENTAL HEALTH:      psychopathology: Anxiety, Depression, Bipolar, PTSD, Panic Attacks    psychopharmacology: Zyreca, Lamictal, Prazosin, Diazepam    psychiatric med provider: [name]      past psychiatric hx: (hospitalizations, self harm, SA, SI HI); suicide attempt 1 yr ago     counseling:  Jenet Miss, [facility], [place] - weekly    meetings:   No     SOCIAL ISSUES:     employment: disabled    housing:  Lives alone, rents in housing     family/support: Seperated; son is schizophrenic and is homeless - she worries  about him    legal:   none    possess drivers license: no    current medical concerns: none    medical hx:  Seizure, HBP, Head Injury, CRPS left arm     surgical hx: none    stressors:   her health    pregnancy/ lmp/ contraception: has had hyst      Review of Systems   Constitutional:  Negative for malaise/fatigue.   Gastrointestinal:  Negative for abdominal pain, constipation, diarrhea, nausea and vomiting.   Psychiatric/Behavioral:  Negative for substance abuse and suicidal ideas.         There is no problem list on file for this patient.    Current Outpatient Medications   Medication Instructions    buprenorphine-naloxone (Suboxone) 8-2 mg SL tablet 1 tablet, sublingual, 3 times daily    carisoprodol (SOMA) 350 mg, oral, Daily PRN    diazePAM (VALIUM) 5 mg, oral, 3 times daily    lamoTRIgine (LaMICtal) 200 mg tablet 1 tablet, oral, Daily    naloxone (NARCAN) 4 mg, nasal, As needed, May repeat every 2-3 minutes if needed, alternating nostrils, until medical assistance becomes available.    OLANZapine (ZyPREXA) 2.5 mg tablet 1 tablet, oral, Nightly    OLANZapine (ZyPREXA) 20 mg tablet 1 tablet, oral, Nightly    prazosin (Minipress) 5 mg capsule 1  capsule, oral, Nightly    prazosin (MINIPRESS) 2 mg, oral, Nightly     Allergies   Allergen Reactions    Propoxyphene Other, Nausea Only and Unknown    Ultram [Tramadol]     Iodinated Contrast Media Other    Metaxalone Hives, Swelling and Unknown    Propoxyphene N-Acetaminophen Hives    Ciprofloxacin        OBJECTIVE    Last UDS/comment: appropriate / reviewed on this date 09/06/2023    UDS reviewed.  Appropriate and consistent with PMP .   Last oral swab/comment:    Last pill/film count:     09/06/2023 PMP reviewed and is appropriate.                 Labs:  HIV AB: none on record  HCV AB: none on record  LFTS: ast 14; alt 15 (oct 2023)    PHYSICAL EXAM   T=  No VS today   ROS x 10:  All negative   Gen: A/Ox 3    HEENT: NC- AT-PERRLA  Neck: Supple, NL ROM    PULMONARY::  CARDIAC:  Ext: No C/C/E    Neuro: CN II-XII grossly intact. STR 5/5 times 4- Gait NL. No tremor.    MS: Mood depressed - Affect: flat Attention: Nl       ASSESSMENT/PLAN  Patient continues to need ongoing care related to opioid use disorder.    Issues Addressed         Codes      Uncomplicated opioid dependence (Multi-HCC)    -  Primary F11.20    Relevant Orders    Urine Drug Screen          Patient remains stable and free from illicit opioids on Suboxone (or generic) 24 mg daily.  Continue current maintenance dose.   ERX#42 Suboxone/14D     Previously discussed the importance of Naloxone (Narcan) as a harm reduction strategy for opioid use disorder, emphasizing its role in reversing opioid overdose and recommending it for all patients prescribed opioids. Educated the patient on signs of overdose, proper administration, and the importance of having Narcan accessible for themselves and those around them. Encouraged the patient to inform family or close contacts on its use, and reviewed prescription availability through pharmacies and community programs. Patient expressed understanding and was advised to reach out with any questions. Prescription was not sent/ patient declined    Urine drug screen ordered to monitor compliance with buprenorphine treatment and/or to evaluate for use of illicit substances.    Encouraged patient to maintain sobriety and required regular counseling.  Support given.  Follow up in 2 weeks. Call us  if any concerns.      2-  TOBACCO DEPENDENCE:  The risks of smoking  & smoking cessation was  previously D/W patient  In pre-contemplation state     Biweekly visits are medically necessary due to relapsing remitting nature of the disease.     Time spent: 10 minutes

## 2023-09-20 ENCOUNTER — Ambulatory Visit: Admit: 2023-09-20 | Discharge: 2023-09-20 | Payer: MEDICARE | Primary: Family Medicine

## 2023-09-20 DIAGNOSIS — F1120 Opioid dependence, uncomplicated: Secondary | ICD-10-CM

## 2023-09-20 MED ORDER — buprenorphine-naloxone (Suboxone) 8-2 mg SL tablet
8-2 | ORAL_TABLET | Freq: Three times a day (TID) | SUBLINGUAL | 0 refills | 28.00000 days | Status: DC
Start: 2023-09-20 — End: 2023-10-04

## 2023-09-20 NOTE — Progress Notes (Signed)
 [facility]  [address]  [place] [address]  [phone]      Patient ID: [name] is a 48 y.o. female who presents for Opioid Dependence.    SUBJECTiVE    Chief Complaint:  Opioid Use Disorder.  Patient is taking Suboxone (buprenorphine/naloxone) 24 mg/day tablets.  Patient denies issues with medication, denies cravings for drugs, or withdrawal symptoms.    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  Pt has previously had injections, didn't really work.  Has gotten motorized wheelchair - needs assistance up stairs.  Though she likes the [place] office, discussed changing facilities to [facility] office which has ramp.  She discussed a worry about getting into a van in her wheelchair, afraid she might fall off. Would think safeguards are in place - should discuss with provider so the pt doesn't limit her options.   Patient does live in a handicap accessible apartment.    As of today patient denies any illicit use , cravings or side effects.  Doing well on suboxone for over a year.  Denies ETOH, she takes diazepam 5 mg TID( lowered from 10) and denies mis-use   No new allergies  Declines tapering at this time  Wishes to continue on dose, buprenorphine/nx tablets 24 mg daily.  Pt will continue counseling with Katie Marasco biweekly.       MENTAL HEALTH:      psychopathology: Anxiety, Depression, Bipolar, PTSD, Panic Attacks    psychopharmacology: Zyreca, Lamictal, Prazosin, Diazepam    psychiatric med provider: [name]      past psychiatric hx: (hospitalizations, self harm, SA, SI HI); suicide attempt 1 yr ago     counseling:  Jenet Miss, [facility], [place] - weekly    meetings:   No     SOCIAL ISSUES:     employment: disabled    housing:  Lives alone, rents in housing     family/support: Seperated; son is schizophrenic and is homeless - she worries about him    legal:    none    possess drivers license: no    current medical concerns: none    medical hx:  Seizure, HBP, Head Injury, CRPS left arm     surgical hx: none    stressors:   her health    pregnancy/ lmp/ contraception: has had hyst      Review of Systems   Constitutional:  Negative for malaise/fatigue.   Gastrointestinal:  Negative for abdominal pain, constipation, diarrhea, nausea and vomiting.   Psychiatric/Behavioral:  Negative for substance abuse and suicidal ideas.         There is no problem list on file for this patient.    Current Outpatient Medications   Medication Instructions    buprenorphine-naloxone (Suboxone) 8-2 mg SL tablet 1 tablet, sublingual, 3 times daily    carisoprodol (SOMA) 350 mg, oral, Daily PRN    diazePAM (VALIUM) 5 mg, oral, 3 times daily    lamoTRIgine (LaMICtal) 200 mg tablet 1 tablet, oral, Daily    naloxone (NARCAN) 4 mg, nasal, As needed, May repeat every 2-3 minutes if needed, alternating nostrils, until medical assistance becomes available.    OLANZapine (ZyPREXA) 2.5 mg tablet 1 tablet, oral, Nightly    OLANZapine (ZyPREXA) 20 mg tablet 1 tablet, oral, Nightly    prazosin (Minipress) 5 mg capsule 1 capsule, oral, Nightly  prazosin (MINIPRESS) 2 mg, oral, Nightly     Allergies   Allergen Reactions    Propoxyphene Other, Nausea Only and Unknown    Ultram [Tramadol]     Iodinated Contrast Media Other    Metaxalone Hives, Swelling and Unknown    Propoxyphene N-Acetaminophen Hives    Ciprofloxacin        OBJECTIVE    Last UDS/comment: appropriate / reviewed on this date 09/20/2023    UDS reviewed.  Appropriate and consistent with PMP .   Last oral swab/comment:    Last pill/film count:     09/20/2023 PMP reviewed and is appropriate.                 Labs:  HIV AB: none on record  HCV AB: none on record  LFTS: ast 14; alt 15 (oct 2023)       ASSESSMENT/PLAN  Patient continues to need ongoing care related to opioid use disorder.    Issues Addressed         Codes      Uncomplicated opioid  dependence (Multi-HCC)    -  Primary F11.20    Relevant Orders    Urine Drug Screen          Patient remains stable and free from illicit opioids on Suboxone (or generic) 24 mg daily.  Continue current maintenance dose.   ERX#42 Suboxone/14D     Previously discussed the importance of Naloxone (Narcan) as a harm reduction strategy for opioid use disorder, emphasizing its role in reversing opioid overdose and recommending it for all patients prescribed opioids. Educated the patient on signs of overdose, proper administration, and the importance of having Narcan accessible for themselves and those around them. Encouraged the patient to inform family or close contacts on its use, and reviewed prescription availability through pharmacies and community programs. Patient expressed understanding and was advised to reach out with any questions. Prescription was not sent/ patient declined    Urine drug screen ordered to monitor compliance with buprenorphine treatment and/or to evaluate for use of illicit substances.    Encouraged patient to maintain sobriety and required regular counseling.  Support given.  Follow up in 2 weeks. Call us  if any concerns.      2-  TOBACCO DEPENDENCE:  The risks of smoking  & smoking cessation was  previously D/W patient  In pre-contemplation state     Biweekly visits are medically necessary due to relapsing remitting nature of the disease.     Time spent: 10 minutes

## 2023-09-20 NOTE — Progress Notes (Cosign Needed)
 Progress Note      Client Name: [name], [name]  D.O.B.  07/15/1975   Stressor(s)/Significant Changes in Client's Condition     ?  No Significant Change from Last Visit    ?  Mood/Affect Low   ?  Thought Process/Orientation Oriented x3   ?  Behavior/Functioning Congruent with mood   ?  Substance use Reports stable on MAT   Danger to:    x None ?  Self   ?  Others  ?  Property      ?  Ideation ? Plan  ? Intent   ?Attempt ? Other:   ?  Urges   ?  Cravings  Comments: Denies cravings/urges for illicit substance use    Therapeutic Intervention and Progress Toward Goal(s):    Clinician offered space for MW to discuss how her experience using her wheelchair more has been as she discussed a recent outing to a grocery store. She reported that things at home have Been bad. Clinician practiced empathic listening as she discussed this further. MW reported that Lately it's been one thing after another after another. She identified stressors within her housing complex, family and grief. She noted that she has Been crying as she has been reflecting about her parents who have passed. Clinician inquired about triggers around this and MW reported that her father's birthday was Two days ago. Clinician and MW reviewed coping strategies. MW was receptive to discussion. MW reported that she has been trying to get out of her home more so she can Get used to being out. MW reported that her housing complex offers trips to places like the store and food pantry. Clinician offered encouragement. MW discussed her son who is incarcerated and how she would like him to be in a supportive environment when he is released. Clinician and MW discussed this further. She returns in two weeks. Most recent UDS reviewed. No current SI/AH/VH/HI and is agreeable to ask for help when needed.   Time spent: 61 minutes        Provider Signature/Credentials:  Molli Angelucci, LCSW   Date: 09/20/23   Referring Physician: Larance Plater, NP

## 2023-10-04 ENCOUNTER — Ambulatory Visit: Admit: 2023-10-04 | Discharge: 2023-10-04 | Payer: MEDICARE | Primary: Family Medicine

## 2023-10-04 DIAGNOSIS — F1120 Opioid dependence, uncomplicated: Secondary | ICD-10-CM

## 2023-10-04 MED ORDER — buprenorphine-naloxone (Suboxone) 8-2 mg SL tablet
8-2 | ORAL_TABLET | Freq: Three times a day (TID) | SUBLINGUAL | 0 refills | 28.00000 days | Status: AC
Start: 2023-10-04 — End: 2023-10-18

## 2023-10-04 NOTE — Progress Notes (Cosign Needed)
 Progress Note      Client Name: [name], [name]  D.O.B.  Oct 31, 1975   Stressor(s)/Significant Changes in Client's Condition     ?  No Significant Change from Last Visit    ?  Mood/Affect Euthymic   ?  Thought Process/Orientation Oriented x3   ?  Behavior/Functioning WNL   ?  Substance use Reports stable on MAT   Danger to:    x None ?  Self   ?  Others  ?  Property      x  Ideation ? Plan  ? Intent   ?Attempt ? Other: Passive SI with no plan, intent or attempt   ?  Urges   ?  Cravings  Comments: Denies cravings/urges for illicit substance use    Therapeutic Intervention and Progress Toward Goal(s):    MW reported I've been crying (not today) and that she has had a Rough few weeks. Clinician offered space for her to discuss this further. She reported They think I might have Parkinson's. She reported that sometimes parts of her body Shake and noted I think it's my anxiety. She reported that in spite of this she plans to work with her medical providers to figure out the dx of this. She reported that she had been through a similar experience in the past when it was thought that she may have MS. Clinician and MW discussed her feelings about this and the benefits of finding a dx so she can receive proper tx. MW was receptive to discussion. Clinician inquired about managing anxiety and coping skills. MW reported I take my medicine and Try to stay calm. She reported that she finds it helpful to be by herself when she is feeling anxious. Clinician and MW discussed mental health further. She endorsed passive SI and how she may have thoughts about this such as I'll be with my mom. Clinician assessed safety. MW denies plan, intent and attempt and agreeable to ask for help. She noted that when this kind of thinking occurs she is usually in bed trying to go to sleep. She reported that she continues to work with her behavioral health care providers at [facility]. Clinician practiced empathic listening as MW discussed  ongoing stress related to her son's incarceration and her feelings about what he will do when he is released. She returns in two weeks. Most recent UDS reviewed. No current SI/AH/VH/HI and is agreeable to ask for help when needed.   Time spent: 67 minutes        Provider Signature/Credentials:  [name], LCSW   Date: 10/04/23   Referring Physician: Earnie Larry, NP

## 2023-10-04 NOTE — Progress Notes (Signed)
 [facility]  [address]  [place] [address]  [phone]      Patient ID: [name] is a 48 y.o. female who presents for Opioid Dependence.    SUBJECTiVE    Chief Complaint:  Opioid Use Disorder.  Patient is taking Suboxone  (buprenorphine /naloxone ) 24 mg/day tablets.  Patient denies issues with medication, denies cravings for drugs, or withdrawal symptoms.    Phase of treatment: Maintenance    10/04/23  Today's issues:   As of today patient denies any illicit use , cravings or side effects.  Doing well on suboxone  for over a year.  Denies ETOH, she takes diazepam 5 mg TID( lowered from 10) and denies mis-use   No new allergies  Declines tapering at this time  Wishes to continue on dose, buprenorphine /nx tablets 24 mg daily.  Pt will continue counseling with Katie Marasco biweekly.     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  Pt has previously had injections, didn't really work.  Has gotten motorized wheelchair - needs assistance up stairs.  Though she likes the [place] office, discussed changing facilities to [facility] office which has ramp.  She discussed a worry about getting into a van in her wheelchair, afraid she might fall off. Would think safeguards are in place - should discuss with provider so the pt doesn't limit her options.   Patient does live in a handicap accessible apartment.       MENTAL HEALTH:      psychopathology: Anxiety, Depression, Bipolar, PTSD, Panic Attacks    psychopharmacology: Zyreca, Lamictal, Prazosin, Diazepam    psychiatric med provider: [name]      past psychiatric hx: (hospitalizations, self harm, SA, SI HI); suicide attempt 1 yr ago     counseling:  [name], [facility], [place] - weekly    meetings:   No     SOCIAL ISSUES:     employment: disabled    housing:  Lives alone, rents in housing     family/support: Seperated; son is schizophrenic and is homeless - she worries about him    legal:    none    possess drivers license: no    current medical concerns: none    medical hx:  Seizure, HBP, Head Injury, CRPS left arm     surgical hx: none    stressors:   her health    pregnancy/ lmp/ contraception: has had hyst       There is no problem list on file for this patient.    Current Outpatient Medications   Medication Instructions    buprenorphine -naloxone  (Suboxone ) 8-2 mg SL tablet 1 tablet, sublingual, 3 times daily    carisoprodol (SOMA) 350 mg, oral, Daily PRN    diazePAM (VALIUM) 5 mg, oral, 3 times daily    lamoTRIgine (LaMICtal) 200 mg tablet 1 tablet, oral, Daily    naloxone  (NARCAN ) 4 mg, nasal, As needed, May repeat every 2-3 minutes if needed, alternating nostrils, until medical assistance becomes available.    OLANZapine (ZyPREXA) 2.5 mg tablet 1 tablet, oral, Nightly    OLANZapine (ZyPREXA) 20 mg tablet 1 tablet, oral, Nightly    prazosin (Minipress) 5 mg capsule 1 capsule, oral, Nightly    prazosin (MINIPRESS) 2 mg, oral, Nightly    propranolol (INDERAL) 10 mg, Daily PRN     Allergies   Allergen Reactions    Propoxyphene Other, Nausea Only and Unknown    Ultram  [Tramadol ]  Iodinated Contrast Media Other    Metaxalone Hives, Swelling and Unknown    Propoxyphene N-Acetaminophen Hives    Ciprofloxacin        OBJECTIVE    Last UDS/comment: appropriate / reviewed on this date 10/04/2023    UDS reviewed.  Appropriate and consistent with PMP .   Last oral swab/comment:    Last pill/film count:     10/04/2023 PMP reviewed and is appropriate.                 Labs:  HIV AB: none on record  HCV AB: none on record  LFTS: ast 14; alt 15 (oct 2023)       ASSESSMENT/PLAN  Patient continues to need ongoing care related to opioid use disorder.    Issues Addressed         Codes      Uncomplicated opioid dependence (Multi-HCC)    -  Primary F11.20    Relevant Orders    Urine Drug Screen          Patient remains stable and free from illicit opioids on Suboxone  (or generic) 24 mg daily.  Continue current  maintenance dose.   ERX#42 Suboxone /14D     Previously discussed the importance of Naloxone  (Narcan ) as a harm reduction strategy for opioid use disorder, emphasizing its role in reversing opioid overdose and recommending it for all patients prescribed opioids. Educated the patient on signs of overdose, proper administration, and the importance of having Narcan  accessible for themselves and those around them. Encouraged the patient to inform family or close contacts on its use, and reviewed prescription availability through pharmacies and community programs. Patient expressed understanding and was advised to reach out with any questions. Prescription was not sent/ patient declined    Urine drug screen ordered to monitor compliance with buprenorphine  treatment and/or to evaluate for use of illicit substances.    Encouraged patient to maintain sobriety and required regular counseling.  Support given.  Follow up in 2 weeks. Call us  if any concerns.        Biweekly visits are medically necessary due to relapsing remitting nature of the disease.     Time spent: 10 minutes

## 2023-10-18 ENCOUNTER — Ambulatory Visit: Admit: 2023-10-18 | Discharge: 2023-10-18 | Payer: MEDICARE | Primary: Family Medicine

## 2023-10-18 DIAGNOSIS — F1120 Opioid dependence, uncomplicated: Principal | ICD-10-CM

## 2023-10-18 NOTE — Progress Notes (Cosign Needed)
 Progress Note      Client Name: Kim, Ingram D.O.B.  1975-05-01   Stressor(s)/Significant Changes in Client's Condition     ?  No Significant Change from Last Visit    ?  Mood/Affect Low, Anxious   ?  Thought Process/Orientation Oriented x3   ?  Behavior/Functioning Congruent with mood   ?  Substance use Reports stable on MAT   Danger to:    x None ?  Self   ?  Others  ?  Property      ?  Ideation ? Plan  ? Intent   ?Attempt ? Other:   ?  Urges   ?  Cravings  Comments: Denies cravings/urges for illicit substance use    Therapeutic Intervention and Progress Toward Goal(s):    MW reported that her son has a Parole hearing in a few weeks. She endorsed feeling anxious about his future and choices that he makes. Regarding one particular issue he reported to her, MW stated I was so upset with him. Clinician and MW discussed this further. Clinician practiced empathic listening as she endorsed desire to have a relationship with her other two children but it seems that they are not feeling the same and offered examples via her experience. MW reported that she has feelings of Guilt about the past related to parenting and current relationships with her children and discussed this further with clinician. Clinician offered psycho education regarding coping skills and radical acceptance. MW was receptive to discussion. MW reported that the recent death of an acquaintance Triggered a lot of feelings. Clinician offered space for her to process her feelings. She identified coping skills she has recently utilized such as Trying to be around people and noted that she and a friend may visit the aquarium. She also noted I haven't felt suicidal. She reported that she remains engaged with her behavioral health care providers at [facility]. She returns in two weeks. Most recent UDS reviewed. No current SI/AH/VH/HI and is agreeable to ask for help when needed.   Time spent: 60 minutes        Provider Signature/Credentials:  [name], LCSW   Date: 10/18/23   Referring Physician: Earnie Larry, NP

## 2023-10-18 NOTE — Progress Notes (Signed)
 [facility]  [address], [address]  [place] [address]  [phone]      Patient ID: [name] is a 48 y.o. female who presents for Opioid Dependence.    SUBJECTiVE    Chief Complaint:  Opioid Use Disorder.  Patient is taking Suboxone  (buprenorphine /naloxone ) 24 mg/day tablets.  Patient denies issues with medication, denies cravings for drugs, or withdrawal symptoms.    Phase of treatment: Maintenance    10/18/23  Today's issues:   As of today patient denies any illicit use , cravings or side effects.  Doing well on suboxone  for over a year.  Denies ETOH, she takes diazepam 5 mg TID( lowered from 10) and denies mis-use   No new allergies  Declines tapering at this time  Wishes to continue on dose, buprenorphine /nx tablets 24 mg daily. States she has an old rx at pharmacy - [facility] - rx out of stock but will be ready tomorrow  Pt will continue counseling with Katie Marasco biweekly.     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  Pt has previously had injections, didn't really work.  Has gotten motorized wheelchair - needs assistance up stairs.  Though she likes the [place] office, discussed changing facilities to [facility] office which has ramp.  She discussed a worry about getting into a van in her wheelchair, afraid she might fall off. Would think safeguards are in place - should discuss with provider so the pt doesn't limit her options.   Patient does live in a handicap accessible apartment.       MENTAL HEALTH:      psychopathology: Anxiety, Depression, Bipolar, PTSD, Panic Attacks    psychopharmacology: Zyreca, Lamictal, Prazosin, Diazepam    psychiatric med provider: [name]      past psychiatric hx: (hospitalizations, self harm, SA, SI HI); suicide attempt 1 yr ago     counseling:  [name], [facility], [place] - weekly    meetings:   No     SOCIAL ISSUES:     employment: disabled    housing:  Lives alone, rents in  housing     family/support: Seperated; son is schizophrenic and is homeless - she worries about him    legal:   none    possess drivers license: no    current medical concerns: none    medical hx:  Seizure, HBP, Head Injury, CRPS left arm     surgical hx: none    stressors:   her health    pregnancy/ lmp/ contraception: has had hyst       There is no problem list on file for this patient.    Current Outpatient Medications   Medication Instructions    buprenorphine -naloxone  (Suboxone ) 8-2 mg SL tablet 1 tablet, sublingual, 3 times daily    carisoprodol (SOMA) 350 mg, oral, Daily PRN    diazePAM (VALIUM) 5 mg, oral, 3 times daily    lamoTRIgine (LaMICtal) 200 mg tablet 1 tablet, oral, Daily    naloxone  (NARCAN ) 4 mg, nasal, As needed, May repeat every 2-3 minutes if needed, alternating nostrils, until medical assistance becomes available.    OLANZapine (ZyPREXA) 2.5 mg tablet 1 tablet, oral, Nightly    OLANZapine (ZyPREXA) 20 mg tablet 1 tablet, oral, Nightly    prazosin (Minipress) 5 mg capsule 1 capsule, oral, Nightly    prazosin (MINIPRESS) 2 mg, oral, Nightly    propranolol (INDERAL) 10 mg,  Daily PRN     Allergies   Allergen Reactions    Propoxyphene Other, Nausea Only and Unknown    Ultram  [Tramadol ]     Iodinated Contrast Media Other    Metaxalone Hives, Swelling and Unknown    Propoxyphene N-Acetaminophen Hives    Ciprofloxacin        OBJECTIVE    Last UDS/comment: appropriate / reviewed on this date 10/18/2023    UDS reviewed.  Appropriate and consistent with PMP .   Last oral swab/comment:    Last pill/film count:     10/18/2023 PMP reviewed and is appropriate.    10/04/23 RX #42 not filled yet             Labs:  HIV AB: none on record  HCV AB: none on record  LFTS: ast 14; alt 15 (oct 2023)       ASSESSMENT/PLAN  Patient continues to need ongoing care related to opioid use disorder.    Issues Addressed         Codes      Uncomplicated opioid dependence (Multi-HCC)    -  Primary F11.20    Relevant Orders    Urine  Drug Screen          Patient remains stable and free from illicit opioids on Suboxone  (or generic) 24 mg daily.  Continue current maintenance dose.   No RX Needed this visit - RX#42 given on 10/04/23 has not been filled yet pharmacy was out of stock, pt has some at home and was able to get by.     Previously discussed the importance of Naloxone  (Narcan ) as a harm reduction strategy for opioid use disorder, emphasizing its role in reversing opioid overdose and recommending it for all patients prescribed opioids. Educated the patient on signs of overdose, proper administration, and the importance of having Narcan  accessible for themselves and those around them. Encouraged the patient to inform family or close contacts on its use, and reviewed prescription availability through pharmacies and community programs. Patient expressed understanding and was advised to reach out with any questions. Prescription was not sent/ patient declined    Urine drug screen ordered to monitor compliance with buprenorphine  treatment and/or to evaluate for use of illicit substances.    Encouraged patient to maintain sobriety and required regular counseling.  Support given.  Follow up in 2 weeks. Call us  if any concerns.    Biweekly visits are medically necessary due to relapsing remitting nature of the disease.     Time spent: 10 minutes

## 2023-11-01 ENCOUNTER — Ambulatory Visit: Admit: 2023-11-01 | Discharge: 2023-11-01 | Payer: MEDICARE | Primary: Family Medicine

## 2023-11-01 DIAGNOSIS — F1120 Opioid dependence, uncomplicated: Principal | ICD-10-CM

## 2023-11-01 MED ORDER — buprenorphine-naloxone (Suboxone) 8-2 mg SL tablet
8-2 | ORAL_TABLET | Freq: Three times a day (TID) | SUBLINGUAL | 0 refills | 28.00000 days | Status: DC
Start: 2023-11-01 — End: 2023-11-15

## 2023-11-01 NOTE — Progress Notes (Signed)
 Progress Note      Client Name: Karsen, Fellows D.O.B.  07-28-1975   Stressor(s)/Significant Changes in Client's Condition     ?  No Significant Change from Last Visit    ?  Mood/Affect Low   ?  Thought Process/Orientation Oriented x3   ?  Behavior/Functioning Congruent with mood   ?  Substance use Reports stable on MAT   Danger to:    x None ?  Self   ?  Others  ?  Property      ?  Ideation ? Plan  ? Intent   ?Attempt ? Other:   ?  Urges   ?  Cravings  Comments: Denies cravings/urges for illicit substance use    Therapeutic Intervention and Progress Toward Goal(s):    Clinician offered safe space for MW to discuss how her stepfather is hospitalized following a Major heart attack. She reported that she is questioning whether or not she should visit him. Clinician asked clarifying questions. MW was receptive to discussion. MW reported It's a really hard decision to make and I don't know what to do. Clinician utilized CBT to offer space for MW to address this further. She discussed her feelings and clinician offered space for her to process them. She discussed her son and a statement that he made which caused her to question this decision further. She then reported that her son is being Extradited to NH and may have the opportunity to go to a [employer]. Clinician and MW discussed this further. She returns in two weeks. Most recent UDS reviewed. No current SI/AH/VH/HI and is agreeable to ask for help when needed.   Time spent: 44 minutes        Provider Signature/Credentials:  [name], LCSW   Date: 11/01/23   Referring Physician: Earnie Larry, NP

## 2023-11-01 NOTE — Progress Notes (Signed)
 [facility]  [address]  [place] [address]  [phone]      Patient ID: [name] is a 48 y.o. female who presents for Opioid Dependence.    SUBJECTiVE    Chief Complaint:  Opioid Use Disorder.  Patient is taking Suboxone  (buprenorphine /naloxone ) 24 mg/day tablets.  Patient denies issues with medication, denies cravings for drugs, or withdrawal symptoms.    Phase of treatment: Maintenance    11/01/23  Today's issues:   As of today patient denies any illicit use , cravings or side effects.  Doing well on suboxone  for over a year.  Denies ETOH, she takes diazepam 5 mg TID( lowered from 10) and denies mis-use   Starting the decreased dose of valium today 7/23 of 5 mg BID and 2.5 mg daily  No new allergies  Declines tapering at this time  Wishes to continue on dose, buprenorphine /nx tablets 24 mg daily.   Pt will continue counseling with Katie Marasco biweekly.     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  Pt has previously had injections, didn't really work.  Has gotten motorized wheelchair - needs assistance up stairs.  Though she likes the [place] office, discussed changing facilities to [facility] office which has ramp.  She discussed a worry about getting into a van in her wheelchair, afraid she might fall off. Would think safeguards are in place - should discuss with provider so the pt doesn't limit her options.   Patient does live in a handicap accessible apartment.       MENTAL HEALTH:      psychopathology: Anxiety, Depression, Bipolar, PTSD, Panic Attacks    psychopharmacology: Zyreca, Lamictal, Prazosin, Diazepam    psychiatric med provider: [name]      past psychiatric hx: (hospitalizations, self harm, SA, SI HI); suicide attempt 1 yr ago     counseling:  [name], [facility], [place] - weekly    meetings:   No     SOCIAL ISSUES:     employment: disabled    housing:  Lives alone, rents in housing      family/support: Seperated; son is schizophrenic and is homeless - she worries about him    legal:   none    possess drivers license: no    current medical concerns: none    medical hx:  Seizure, HBP, Head Injury, CRPS left arm     surgical hx: none    stressors:   her health    pregnancy/ lmp/ contraception: has had hyst       There is no problem list on file for this patient.    Current Outpatient Medications   Medication Instructions    carisoprodol (SOMA) 350 mg, oral, Daily PRN    diazePAM (VALIUM) 5 mg, oral, 3 times daily    lamoTRIgine (LaMICtal) 200 mg tablet 1 tablet, oral, Daily    naloxone  (NARCAN ) 4 mg, nasal, As needed, May repeat every 2-3 minutes if needed, alternating nostrils, until medical assistance becomes available.    OLANZapine (ZyPREXA) 2.5 mg tablet 1 tablet, oral, Nightly    OLANZapine (ZyPREXA) 20 mg tablet 1 tablet, oral, Nightly    prazosin (Minipress) 5 mg capsule 1 capsule, oral, Nightly    prazosin (MINIPRESS) 2 mg, oral, Nightly    propranolol (INDERAL) 10 mg, Daily PRN     Allergies   Allergen Reactions    Propoxyphene Other, Nausea Only  and Unknown    Ultram  [Tramadol ]     Iodinated Contrast Media Other    Metaxalone Hives, Swelling and Unknown    Propoxyphene N-Acetaminophen Hives    Ciprofloxacin        OBJECTIVE    Last UDS/comment: appropriate / reviewed on this date 11/01/2023    UDS reviewed.  Appropriate and consistent with PMP .   Last oral swab/comment:    Last pill/film count:     11/01/2023 PMP reviewed and is appropriate.               Labs:  HIV AB: none on record  HCV AB: none on record  LFTS: ast 14; alt 15 (oct 2023)       ASSESSMENT/PLAN  Patient continues to need ongoing care related to opioid use disorder.    Issues Addressed         Codes      Uncomplicated opioid dependence (Multi-HCC)    -  Primary F11.20    Relevant Orders    Urine Drug Screen          Patient remains stable and free from illicit opioids on Suboxone  (or generic) 24 mg daily.  Continue current  maintenance dose.     ERX#42 Suboxone /14D    Previously discussed the importance of Naloxone  (Narcan ) as a harm reduction strategy for opioid use disorder, emphasizing its role in reversing opioid overdose and recommending it for all patients prescribed opioids. Educated the patient on signs of overdose, proper administration, and the importance of having Narcan  accessible for themselves and those around them. Encouraged the patient to inform family or close contacts on its use, and reviewed prescription availability through pharmacies and community programs. Patient expressed understanding and was advised to reach out with any questions. Prescription was not sent/ patient declined    Urine drug screen ordered to monitor compliance with buprenorphine  treatment and/or to evaluate for use of illicit substances.    Encouraged patient to maintain sobriety and required regular counseling.  Support given.  Follow up in 2 weeks. Call us  if any concerns.    Biweekly visits are medically necessary due to relapsing remitting nature of the disease.     Time spent: 10 minutes

## 2023-11-15 ENCOUNTER — Encounter: Payer: MEDICARE | Primary: Family Medicine

## 2023-11-15 MED ORDER — buprenorphine-naloxone (Suboxone) 8-2 mg SL tablet
8-2 | ORAL_TABLET | Freq: Three times a day (TID) | SUBLINGUAL | 0 refills | 28.00000 days | Status: DC
Start: 2023-11-15 — End: 2023-11-22

## 2023-11-15 NOTE — Progress Notes (Signed)
 [facility]  [address]  [place] [address]  [phone]      Patient ID: [name] is a 48 y.o. female who presents for Opioid Dependence.    SUBJECTiVE    Chief Complaint:  Opioid Use Disorder.  Patient is taking Suboxone  (buprenorphine /naloxone ) 24 mg/day tablets.  Patient denies issues with medication, denies cravings for drugs, or withdrawal symptoms.    Phase of treatment: Maintenance    11/15/23  Today's issues:   As of today patient denies any illicit use , cravings or side effects.  Doing well on suboxone  for over a year.  Denies ETOH, she takes diazepam 5 mg TID( lowered from 10) and denies mis-use   No new allergies or meds  Declines tapering at this time  Wishes to continue on dose, buprenorphine /nx tablets 24 mg daily.   Thc from dispensary  Pt will continue counseling with [name] biweekly.     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  Pt has previously had injections, didn't really work.  Has gotten motorized wheelchair - needs assistance up stairs.  Though she likes the [place] office, discussed changing facilities to [facility] office which has ramp.  She discussed a worry about getting into a van in her wheelchair, afraid she might fall off. Would think safeguards are in place - should discuss with provider so the pt doesn't limit her options.   Patient does live in a handicap accessible apartment.       MENTAL HEALTH:      psychopathology: Anxiety, Depression, Bipolar, PTSD, Panic Attacks    psychopharmacology: Zyreca, Lamictal, Prazosin, Diazepam    psychiatric med provider: [name]      past psychiatric hx: (hospitalizations, self harm, SA, SI HI); suicide attempt 1 yr ago     counseling:  [name], [facility], [place] - weekly    meetings:   No     SOCIAL ISSUES:     employment: disabled    housing:  Lives alone, rents in housing     family/support: Seperated; son is schizophrenic and is  homeless - she worries about him    legal:   none    possess drivers license: no    current medical concerns: none    medical hx:  Seizure, HBP, Head Injury, CRPS left arm     surgical hx: none    stressors:   her health    pregnancy/ lmp/ contraception: has had hyst       There is no problem list on file for this patient.    Current Outpatient Medications   Medication Instructions    buprenorphine -naloxone  (Suboxone ) 8-2 mg SL tablet 1 tablet, sublingual, 3 times daily    carisoprodol (SOMA) 350 mg, oral, Daily PRN    diazePAM (VALIUM) 5 mg, oral, 3 times daily    lamoTRIgine (LaMICtal) 200 mg tablet 1 tablet, oral, Daily    naloxone  (NARCAN ) 4 mg, nasal, As needed, May repeat every 2-3 minutes if needed, alternating nostrils, until medical assistance becomes available.    OLANZapine (ZyPREXA) 2.5 mg tablet 1 tablet, oral, Nightly    OLANZapine (ZyPREXA) 20 mg tablet 1 tablet, oral, Nightly    prazosin (Minipress) 5 mg capsule 1 capsule, oral, Nightly    prazosin (MINIPRESS) 2 mg, oral, Nightly    propranolol (INDERAL) 10 mg, Daily PRN     Allergies   Allergen Reactions  Propoxyphene Other, Nausea Only and Unknown    Ultram  [Tramadol ]     Iodinated Contrast Media Other    Metaxalone Hives, Swelling and Unknown    Propoxyphene N-Acetaminophen Hives    Ciprofloxacin        OBJECTIVE    Last UDS/comment: appropriate / reviewed on this date 11/15/2023    UDS reviewed.  Appropriate and consistent with PMP .   Last oral swab/comment:    Last pill/film count:     11/15/2023 PMP reviewed and is appropriate.               Labs:  HIV AB: none on record  HCV AB: none on record  LFTS: ast 14; alt 15 (oct 2023)       ASSESSMENT/PLAN  Patient continues to need ongoing care related to opioid use disorder.    Issues Addressed         Codes      Uncomplicated opioid dependence (Multi-HCC)    -  Primary F11.20    Relevant Orders    Urine Drug Screen          Patient remains stable and free from illicit opioids on Suboxone  (or generic)  24 mg daily.  Continue current maintenance dose.     ERX#42 Suboxone /14D    Previously discussed the importance of Naloxone  (Narcan ) as a harm reduction strategy for opioid use disorder, emphasizing its role in reversing opioid overdose and recommending it for all patients prescribed opioids. Educated the patient on signs of overdose, proper administration, and the importance of having Narcan  accessible for themselves and those around them. Encouraged the patient to inform family or close contacts on its use, and reviewed prescription availability through pharmacies and community programs. Patient expressed understanding and was advised to reach out with any questions. Prescription was not sent/ patient declined    Urine drug screen ordered to monitor compliance with buprenorphine  treatment and/or to evaluate for use of illicit substances.    Encouraged patient to maintain sobriety and required regular counseling.  Support given.  Follow up in 2 weeks. Call us  if any concerns.    Biweekly visits are medically necessary due to relapsing remitting nature of the disease.     Time spent: 10 minutes

## 2023-11-15 NOTE — Telephone Encounter (Signed)
 11/15/23 - rx sent for #21 tabs as pt had severe migraine -- [facility] NP

## 2023-11-15 NOTE — Telephone Encounter (Signed)
 Pt cancelled her visit today, she is not feeling well.  Requesting one week bridge RX of Suboxone . Follow up scheduled for 11/22/23

## 2023-11-22 ENCOUNTER — Ambulatory Visit: Admit: 2023-11-22 | Discharge: 2023-11-22 | Payer: MEDICARE | Primary: Family Medicine

## 2023-11-22 DIAGNOSIS — F1120 Opioid dependence, uncomplicated: Principal | ICD-10-CM

## 2023-11-22 MED ORDER — buprenorphine-naloxone (Suboxone) 8-2 mg SL tablet
8-2 | ORAL_TABLET | Freq: Three times a day (TID) | SUBLINGUAL | 0 refills | 28.00000 days | Status: DC
Start: 2023-11-22 — End: 2023-12-06

## 2023-11-29 ENCOUNTER — Telehealth: Admit: 2023-11-29 | Discharge: 2023-11-29 | Payer: MEDICARE | Primary: Family Medicine

## 2023-11-29 ENCOUNTER — Encounter: Payer: MEDICARE | Primary: Family Medicine

## 2023-11-29 DIAGNOSIS — F1120 Opioid dependence, uncomplicated: Principal | ICD-10-CM

## 2023-11-29 NOTE — Progress Notes (Signed)
 Progress Note      Client Name: Krysten, Veronica D.O.B.  January 30, 1976   Stressor(s)/Significant Changes in Client's Condition     ?  No Significant Change from Last Visit    ?  Mood/Affect Stressed   ?  Thought Process/Orientation Oriented x3   ?  Behavior/Functioning WNL   ?  Substance use Reports stable on MAT   Danger to:    x None ?  Self   ?  Others  ?  Property      ?  Ideation ? Plan  ? Intent   ?Attempt ? Other:   ?  Urges   ?  Cravings  Comments: Denies cravings/urges for illicit substance use   Therapeutic Intervention and Progress Toward Goal(s):    This session was initially scheduled as a telehealth, however MW reported that she was experiencing difficulty when she was trying to sign on and could not connect when clinician called. Session resumed over phone. Clinician offered safe space for MW to process her feelings regarding her step father's recent passing. She noted He's done so many bad things. Clinician utilized CBT when MW utilized negative self talk regarding her reaction to this passing. She discussed her family dynamics further. She endorsed that her incarcerated son's situation continues to be a source of stress, It's just hard. She noted, My son went up for parole and he didn't get it. Clinician and MW discussed this further. Clinician inquired about life at her apartment complex as she has identified stress there in prior sessions. She reported that her neighbor she has discussed Has left me alone. She reported that she remains friendly with a different neighbor and that she will join her at times to go out. Clinician practiced empathic listening as MW discussed medical related issues and how she is coping with using her wheelchair more. She reported that she has an appointment with her neurologist in November. She reported that she continues to meet with behavioral health care providers through [facility]. Agreeable to f/u regarding next scheduled session. No current SI/AH/VH/HI and is  agreeable to ask for help when needed. This real-time, interactive virtual Telehealth encounter was done by phone with the patient's verbal consent. The practitioner (or I) has/have the ability to do a real time audio/video encounter, however, the patient declined or unable to engage with this modality. Two patient identifiers were used and confirmed. Physical location of home to the patient was located in [place] at the time of the visit.  Physical location of the provider: office. Other participants/involvement: none  Time spent: 55 minutes          Provider Signature/Credentials:  [name], LCSW   Date: 11/29/23   Referring Physician: Dr. Carmin

## 2023-12-06 ENCOUNTER — Ambulatory Visit
Admit: 2023-12-06 | Discharge: 2023-12-06 | Payer: MEDICARE | Attending: Obstetrics & Gynecology | Primary: Family Medicine

## 2023-12-06 DIAGNOSIS — F1120 Opioid dependence, uncomplicated: Principal | ICD-10-CM

## 2023-12-06 MED ORDER — buprenorphine-naloxone (Suboxone) 8-2 mg SL tablet
8-2 | ORAL_TABLET | Freq: Three times a day (TID) | SUBLINGUAL | 0 refills | 28.00000 days | Status: DC
Start: 2023-12-06 — End: 2023-12-20

## 2023-12-06 NOTE — Progress Notes (Signed)
 [facility]  [address], [address]  [place] [address]  [phone]      Patient ID: [name] is a 48 y.o. female who presents for Opioid Dependence.    SUBJECTiVE    Chief Complaint:  Opioid Use Disorder.  Patient is taking Suboxone  (buprenorphine /naloxone ) 24 mg/day tablets.  Patient denies issues with medication, denies cravings for drugs, or withdrawal symptoms.    Phase of treatment: Maintenance    12/06/23  Today's issues:    - 14-day prescriptions, nx tabs 8-2 mg, 3 tablets daily.  Generally takes the full daily dose but states she has several tablets left, unsure the amount.  Will give a full prescription today.  At follow-up patient will tell me the amount that she has left so that prescription can be adjusted to prevent large surplus.   - Fell up escalated 2 days ago - will be seen in urgent care today.  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    As of today patient denies any illicit use , cravings or side effects.  Doing well on suboxone  for over a year.  Denies ETOH, she takes diazepam 5 mg x2 + diazepam 2 mg daily ( lowered from 10 mg tablets) and denies mis-use   No new allergies or meds  Declines tapering at this time  Wishes to continue on dose, buprenorphine /nx tablets 24 mg daily.   Thc from dispensary  Pt will continue counseling with [name] biweekly.  Appt next week.   Apt in disabled and elderly complex.    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  Pt has previously had injections, didn't really work.  Has gotten motorized wheelchair - needs assistance up stairs.  Though she likes the [place] office, discussed changing facilities to [facility] office which has ramp.  She discussed a worry about getting into a van in her wheelchair, afraid she might fall off. Would think safeguards are in place - should discuss with provider so the pt doesn't limit her options.   Patient does live in a handicap accessible  apartment.       MENTAL HEALTH:      psychopathology: Anxiety, Depression, Bipolar, PTSD, Panic Attacks    psychopharmacology: Zyreca, Lamictal, Prazosin, Diazepam    psychiatric med provider: Janna Ingram      past psychiatric hx: (hospitalizations, self harm, SA, SI HI); suicide attempt 1 yr ago     counseling:  [name], [name], [place] - weekly    meetings:   No     SOCIAL ISSUES:     employment: disabled    housing:  Lives alone, rents in housing     family/support: Seperated; son is schizophrenic and is homeless - she worries about him    legal:   none    possess drivers license: no    current medical concerns: none    medical hx:  Seizure, HBP, Head Injury, CRPS left arm     surgical hx: none    stressors:   her health    pregnancy/ lmp/ contraception: has had hyst       There is no problem list on file for this patient.    Current Outpatient Medications   Medication Instructions    buprenorphine -naloxone  (Suboxone ) 8-2 mg SL tablet 1 tablet, sublingual, 3 times daily    carisoprodol (SOMA) 350 mg, oral, Daily PRN    diazePAM (VALIUM) 5 mg, oral, 3 times  daily    lamoTRIgine (LaMICtal) 200 mg tablet 1 tablet, oral, Daily    naloxone  (NARCAN ) 4 mg, nasal, As needed, May repeat every 2-3 minutes if needed, alternating nostrils, until medical assistance becomes available.    OLANZapine (ZyPREXA) 2.5 mg tablet 1 tablet, oral, Nightly    OLANZapine (ZyPREXA) 20 mg tablet 1 tablet, oral, Nightly    prazosin (Minipress) 5 mg capsule 1 capsule, oral, Nightly    prazosin (MINIPRESS) 2 mg, oral, Nightly    propranolol (INDERAL) 10 mg, Daily PRN     Allergies   Allergen Reactions    Propoxyphene Other, Nausea Only and Unknown    Ultram  [Tramadol ]     Iodinated Contrast Media Other    Metaxalone Hives, Swelling and Unknown    Propoxyphene N-Acetaminophen Hives    Ciprofloxacin        OBJECTIVE    Last UDS/comment: appropriate / reviewed on this date 12/06/2023    UDS reviewed.  Appropriate and consistent with  PMP .   Last oral swab/comment:    Last pill/film count:     12/06/2023 PMP reviewed and is appropriate.               Labs:  HIV AB: none on record  HCV AB: none on record  LFTS: ast 14; alt 15 (oct 2023)       ASSESSMENT/PLAN  Patient continues to need ongoing care related to opioid use disorder.    Issues Addressed         Codes      Uncomplicated opioid dependence (Multi-HCC)    -  Primary F11.20    Relevant Orders    Urine Drug Screen          Patient remains stable and free from illicit opioids on Suboxone  (or generic) 24 mg daily.  Continue current maintenance dose.     ERX#42 Suboxone /14D    Previously discussed the importance of Naloxone  (Narcan ) as a harm reduction strategy for opioid use disorder, emphasizing its role in reversing opioid overdose and recommending it for all patients prescribed opioids. Educated the patient on signs of overdose, proper administration, and the importance of having Narcan  accessible for themselves and those around them. Encouraged the patient to inform family or close contacts on its use, and reviewed prescription availability through pharmacies and community programs. Patient expressed understanding and was advised to reach out with any questions. Prescription was not sent/ patient declined    Urine drug screen ordered to monitor compliance with buprenorphine  treatment and/or to evaluate for use of illicit substances.    Encouraged patient to maintain sobriety and required regular counseling.  Support given.  Follow up in 2 weeks. Call us  if any concerns.    Biweekly visits are medically necessary due to relapsing remitting nature of the disease.     Time spent: 10 minutes

## 2023-12-13 ENCOUNTER — Telehealth: Admit: 2023-12-13 | Discharge: 2023-12-13 | Payer: MEDICARE | Primary: Family Medicine

## 2023-12-13 DIAGNOSIS — F1120 Opioid dependence, uncomplicated: Principal | ICD-10-CM

## 2023-12-13 NOTE — Progress Notes (Unsigned)
 Progress Note      Client Name: [name], [name]  D.O.B.  1975-12-17   Stressor(s)/Significant Changes in Client's Condition     ?  No Significant Change from Last Visit    ?  Mood/Affect Euthymic, Frustrated    ?  Thought Process/Orientation Oriented x3   ?  Behavior/Functioning WNL   ?  Substance use Reports stable on MAT   Danger to:    x None ?  Self   ?  Others  ?  Property      ?  Ideation ? Plan  ? Intent   ?Attempt ? Other:   ?  Urges   ?  Cravings  Comments: Denies cravings/urges for illicit substance use    Therapeutic Intervention and Progress Toward Goal(s):    MW endorsed that she was experiencing difficulty logging on to scheduled tele session. She was agreeable to speak over the phone. She reported that she has been having a Rough time as I've been falling a lot. She reported that she has a neurology appointment scheduled in November. Clinician inquired about safety in apartment with aides to assist her stability. MW reported They brought me a shower chair. Clinician and MW discussed other possible aides such as bars in the bathroom/shower. She was receptive to discussion. MW endorsed some frustration related to her psychiatry. She reported her provider continues to taper her diazepam rx. She reported that this rx Keeps me calm. She noted I have to advocate for myself (with psych.) Clinician recognized MW for advocating for herself and offered encouragement for her to continue this in the future. She reported that she continues to see her [facility] care providers at [place], with psychiatry provider monthly and counselor weekly. MW stated I'm just upset. Clinician validated her feelings and utilized CBT through discussion and encourage MW to challenge negative beliefs. She reported that she recently switched her insurance, noting It will be helpful. Clinician offered safe space for MW to process emotions related to her stepfather's passing. She noted I can close that chapter. She returns next week.  Most recent UDS reviewed. No current SI/AH/VH/HI and is agreeable to ask for help when needed. This real-time, interactive virtual Telehealth encounter was done by phone with the patient's verbal consent. The practitioner (or I) has/have the ability to do a real time audio/video encounter, however, the patient declined or unable to engage with this modality. Two patient identifiers were used and confirmed. Physical location of home to the patient was located in [place] at the time of the visit.  Physical location of the provider: office. Other participants/involvement: none  Time spent: 55 minutes          Provider Signature/Credentials:  [name], LCSW   Date: 12/14/23   Referring Physician: Dr. Carmin

## 2023-12-20 ENCOUNTER — Ambulatory Visit: Admit: 2023-12-20 | Discharge: 2023-12-20 | Payer: MEDICARE | Primary: Family Medicine

## 2023-12-20 ENCOUNTER — Ambulatory Visit
Admit: 2023-12-20 | Discharge: 2023-12-20 | Payer: MEDICARE | Attending: Obstetrics & Gynecology | Primary: Family Medicine

## 2023-12-20 DIAGNOSIS — F1120 Opioid dependence, uncomplicated: Principal | ICD-10-CM

## 2023-12-20 MED ORDER — buprenorphine-naloxone (Suboxone) 8-2 mg SL tablet
8-2 | ORAL_TABLET | Freq: Three times a day (TID) | SUBLINGUAL | 0 refills | 28.00000 days | Status: DC
Start: 2023-12-20 — End: 2024-01-02

## 2023-12-20 NOTE — Progress Notes (Unsigned)
 Progress Note      Client Name: Kikue, Gerhart D.O.B.  [birth year]   Stressor(s)/Significant Changes in Client's Condition     ?  No Significant Change from Last Visit    ?  Mood/Affect Low   ?  Thought Process/Orientation Oriented x3   ?  Behavior/Functioning WNL   ?  Substance use Reports stable on MAT   Danger to:    x None ?  Self   ?  Others  ?  Property      ?  Ideation ? Plan  ? Intent   ?Attempt ? Other:   ?  Urges   ?  Cravings  Comments: Denies cravings/urges for illicit substance use   Therapeutic Intervention and Progress Toward Goal(s):    Clinician offered safe space for MW to discuss her family and dynamic which have impacted her throughout her life and currently. She reported that her son started college and I am happy and Proud. She reported that she has Tremors that are Getting worse but noted she will be seeing a specialist to discuss this further. She denied SI and discussed a Feeling like I won't wake up. Clinician offered space for her to discuss this further and asked clarifying questions. She again denied SI and that she does not have a plan, intent and has not made attempts. She identified stressors. Clinician and MW discussed these further. She recalled a recent issue in an uber that made her feel uncomfortable. Clinician validated her feelings regarding this experience and offered support and encouragement for her to consider using the female driver option for ride shares. She was receptive to discussion. She returns as tele session in one week. Most recent UDS reviewed. No current SI/AH/VH/HI and is agreeable to ask for help when needed.   Time spent: 46 minutes        Provider Signature/Credentials:  [name], LCSW   Date: 12/20/23   Referring Physician: Dr. Carmin

## 2023-12-20 NOTE — Progress Notes (Signed)
 [facility]  [address], [address]  [place] [address]  [phone]      Patient ID: [name] is a 48 y.o. female who presents for Opioid Dependence.    SUBJECTiVE    Chief Complaint:  Opioid Use Disorder.  Patient is taking Suboxone  (buprenorphine /naloxone ) 24 mg/day tablets.  Patient denies issues with medication, denies cravings for drugs, or withdrawal symptoms.    Phase of treatment: Maintenance    12/20/23  Today's issues:   Appt with katie today.    - 14-day prescriptions, nx tabs 8-2 mg, 3 tablets daily.  Generally takes the full daily dose but states she has several tablets left, unsure the amount.    At follow-up patient will tell me the amount that she has left so that prescription can be adjusted to prevent large surplus. 9/10 as of this had 3 remaining.  30 days.  Being tested for Parkinsons.  Coming off Diazepam.  See below.    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    As of today patient denies any illicit use , cravings or side effects.  Doing well on suboxone  for over a year.  Denies ETOH, she takes diazepam 5 mg x2 + diazepam 2 mg daily ( lowered from 10 mg tablets) and denies mis-use   No new allergies or meds  Declines tapering at this time  Wishes to continue on dose, buprenorphine /nx tablets 24 mg daily.   Thc from dispensary  Pt will continue counseling with [name] biweekly.  Appt next week.   Apt in disabled and elderly complex.    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  Pt has previously had injections, didn't really work.  Has gotten motorized wheelchair - needs assistance up stairs.  Though she likes the [place] office, discussed changing facilities to [place] office which has ramp.  She discussed a worry about getting into a van in her wheelchair, afraid she might fall off. Would think safeguards are in place - should discuss with provider so the pt doesn't limit her options.   Patient does live in  a handicap accessible apartment.       MENTAL HEALTH:      psychopathology: Anxiety, Depression, Bipolar, PTSD, Panic Attacks    psychopharmacology: Zyreca, Lamictal, Prazosin, Diazepam    psychiatric med provider: [name]      past psychiatric hx: (hospitalizations, self harm, SA, SI HI); suicide attempt 1 yr ago     counseling:  [name], [facility], [place] - weekly    meetings:   No     SOCIAL ISSUES:     employment: disabled    housing:  Lives alone, rents in housing     family/support: Seperated; son is schizophrenic and is homeless - she worries about him    legal:   none    possess drivers license: no    current medical concerns: none    medical hx:  Seizure, HBP, Head Injury, CRPS left arm     surgical hx: none    stressors:   her health    pregnancy/ lmp/ contraception: has had hyst       There is no problem list on file for this patient.    Current Outpatient Medications   Medication Instructions    buprenorphine -naloxone  (Suboxone ) 8-2 mg SL tablet 1 tablet, sublingual, 3 times daily    carisoprodol (SOMA) 350 mg, oral, Daily PRN  diazePAM (VALIUM) 5 mg, oral, 3 times daily    lamoTRIgine (LaMICtal) 200 mg tablet 1 tablet, oral, Daily    naloxone  (NARCAN ) 4 mg, nasal, As needed, May repeat every 2-3 minutes if needed, alternating nostrils, until medical assistance becomes available.    OLANZapine (ZyPREXA) 2.5 mg tablet 1 tablet, oral, Nightly    OLANZapine (ZyPREXA) 20 mg tablet 1 tablet, oral, Nightly    prazosin (Minipress) 5 mg capsule 1 capsule, oral, Nightly    prazosin (MINIPRESS) 2 mg, oral, Nightly    propranolol (INDERAL) 10 mg, Daily PRN     Allergies   Allergen Reactions    Propoxyphene Other, Nausea Only and Unknown    Ultram  [Tramadol ]     Iodinated Contrast Media Other    Metaxalone Hives, Swelling and Unknown    Propoxyphene N-Acetaminophen Hives    Ciprofloxacin        OBJECTIVE    Last UDS/comment: appropriate / reviewed on this date 12/20/2023    UDS reviewed.  Appropriate  and consistent with PMP .   Last oral swab/comment:    Last pill/film count:     12/20/2023 PMP reviewed and is appropriate.               Labs:  HIV AB: none on record  HCV AB: none on record  LFTS: ast 14; alt 15 (oct 2023)       ASSESSMENT/PLAN  Patient continues to need ongoing care related to opioid use disorder.    Issues Addressed         Codes      Uncomplicated opioid dependence (Multi-HCC)    -  Primary F11.20    Relevant Orders    Urine Drug Screen          Patient remains stable and free from illicit opioids on Suboxone  (or generic) 24 mg daily.  Continue current maintenance dose.     ERX#42 Suboxone /14D    Previously discussed the importance of Naloxone  (Narcan ) as a harm reduction strategy for opioid use disorder, emphasizing its role in reversing opioid overdose and recommending it for all patients prescribed opioids. Educated the patient on signs of overdose, proper administration, and the importance of having Narcan  accessible for themselves and those around them. Encouraged the patient to inform family or close contacts on its use, and reviewed prescription availability through pharmacies and community programs. Patient expressed understanding and was advised to reach out with any questions. Prescription was not sent/ patient declined    Urine drug screen ordered to monitor compliance with buprenorphine  treatment and/or to evaluate for use of illicit substances.    Encouraged patient to maintain sobriety and required regular counseling.  Support given.  Follow up in 2 weeks. Call us  if any concerns.    Biweekly visits are medically necessary due to relapsing remitting nature of the disease.     Time spent: 15 minutes

## 2023-12-27 ENCOUNTER — Encounter: Admit: 2023-12-27 | Discharge: 2023-12-27 | Payer: MEDICARE | Primary: Family Medicine

## 2023-12-27 DIAGNOSIS — F1120 Opioid dependence, uncomplicated: Principal | ICD-10-CM

## 2023-12-27 NOTE — Behavioral Health Treatment Team (Unsigned)
 OUTPATIENT TREATMENT PLAN  [facility] [place]  [facility]  [address]  [place] [address]  [phone]      Patient:  [name]  DOB:  1975/09/26    Providers on Multidisciplinary Team:   Patient Care Team:  Redell Cooter, MD as PCP - General (Family Medicine)    Diagnosis:   No diagnosis found.    Current Medications:   Current Outpatient Medications   Medication Instructions    buprenorphine -naloxone  (Suboxone ) 8-2 mg SL tablet 1 tablet, sublingual, 3 times daily    carisoprodol (SOMA) 350 mg, oral, Daily PRN    diazePAM (VALIUM) 5 mg, oral, 3 times daily    lamoTRIgine (LaMICtal) 200 mg tablet 1 tablet, oral, Daily    naloxone  (NARCAN ) 4 mg, nasal, As needed, May repeat every 2-3 minutes if needed, alternating nostrils, until medical assistance becomes available.    OLANZapine (ZyPREXA) 2.5 mg tablet 1 tablet, oral, Nightly    OLANZapine (ZyPREXA) 20 mg tablet 1 tablet, oral, Nightly    prazosin (Minipress) 5 mg capsule 1 capsule, oral, Nightly    prazosin (MINIPRESS) 2 mg, oral, Nightly    propranolol (INDERAL) 10 mg, Daily PRN       Patient's SNAP Assessment:    Strengths:  Stable Housing, Ability to [employer] with others, Motivation for Treatment, and Future Oriented & Goal-Directed   Needs:  Improvement in communication/interpersonal skills and Healthy Lifestyle Education/Positive Coping Skills   Abilities: Motivated for Treatment and Positive Goals and Plans for Future    Preferences: Learn new information better Other (please specify): Would like to continue counseling through [facility] while she also utilizes supports through her other behavioral health care provider outside of [facility].     Treatment Goals:    (Use SMART Goals: Specific, Measurable, Attainable, Relevant, Time bound)        Goal 1:  Learn about and utilize communication skills.   Objective 1: Engage in role-play to practice learned communication skills at least three times in counseling  over the next six months.    Goal Stated in patient's words: I have to cut back on what I'm doing.   Target Date (within 6 months): 06/24/24  Intervention    Type:  Individual  Therapy     Frequency:  Bi-Weekly    Duration: 60 minutes  Responsible Clinician: Bernece Gall, LCSW     Goal 2:  Maintain stability on MAT   Objective 2: Continue to engage in counseling at [facility] on a biweekly basis.   Goal Stated in patient's words: I have been taking my medication, going to counseling.   Target Date (within 6 months): 06/24/24  Intervention    Type:  Individual  Therapy     Frequency:  Bi-Weekly    Duration: 60 minutes  Responsible Clinician: Krystalynn Ridgeway, LCSW      Treatment Comments:  Regarding goal 2, MW discussed the progress she has made and expressed that she would like this to continue.    Attestation  I certify that the patient and members of the treatment team participated in the formulation of this treatment plan. I certify that the treatment plan is medically necessary and has been prescribed by me for the treatment of the identified problems, to improve or maintain functioning, and to prevent relapse and hospitalization.

## 2023-12-27 NOTE — Progress Notes (Cosign Needed)
 Progress Note      Client Name: Kim, Ingram D.O.B.  May 29, 1975   Stressor(s)/Significant Changes in Client's Condition     ?  No Significant Change from Last Visit    ?  Mood/Affect Stressed   ?  Thought Process/Orientation Oriented x3   ?  Behavior/Functioning Congruent with mood   ?  Substance use Reports stable on MAT   Danger to:    x None ?  Self   ?  Others  ?  Property      ?  Ideation ? Plan  ? Intent   ?Attempt ? Other:   ?  Urges   ?  Cravings  Comments: Denies cravings/urges for illicit substance use    Therapeutic Intervention and Progress Toward Goal(s):    MW signed on to video session following call from clinician. MW reported I'm okay when asked. She noted It's been a crazy week. Clinician offered space for her to discuss this further. She endorsed ongoing stress related to family dynamics, both past and current. Clinician and MW discussed boundaries further as she reported she has noticed a Pattern related to her son's behavior in communicating with her. She was receptive to discussion about this and plan that she is considering to reduce her stress related to this situation. Clinician practiced reflective listening. Clinician offered safe space for MW as she reflected on an incident that occurred in her life Ten years ago. She noted That wasn't a healthy relationship. Clinician and MW discussed this further. She discussed changes she has made in her life following this impactful relationship. MW noted that she received a delivery from her neighbor of food pantry items and noted she plans on talking with her and may also visit with her peer specialist.  Discussed scheduling and plans to meet in person following in person MAT appt. in October. She was encouraged to reach out before if needed. Most recent UDS reviewed. No current SI/AH/VH/HI and is agreeable to ask for help when needed. This real-time, interactive virtual Telehealth encounter was done by video with the patient's verbal  consent. Two patient identifiers were used and confirmed. Physical location of home to the patient was located in [place] at the time of the visit.  Physical location of the provider: office. Other participants/involvement: none  Time spent: 58 minutes          Provider Signature/Credentials:  [name], LCSW   Date: 12/27/23   Referring Physician: Dr. Carmin

## 2024-01-02 ENCOUNTER — Ambulatory Visit: Admit: 2024-01-02 | Discharge: 2024-01-02 | Payer: MEDICARE | Primary: Family Medicine

## 2024-01-02 DIAGNOSIS — F1120 Opioid dependence, uncomplicated: Principal | ICD-10-CM

## 2024-01-02 MED ORDER — buprenorphine-naloxone (Suboxone) 8-2 mg SL tablet
8-2 | ORAL_TABLET | Freq: Three times a day (TID) | SUBLINGUAL | 0 refills | 28.00000 days | Status: DC
Start: 2024-01-02 — End: 2024-01-16

## 2024-01-02 NOTE — Progress Notes (Signed)
 [facility]  [address], [address]  [place] [address]  [phone]      Patient ID: [name] is a 48 y.o. female who presents for Opioid Dependence.    SUBJECTiVE    Chief Complaint:  Opioid Use Disorder.  Patient is taking Suboxone  (buprenorphine /naloxone ) 24 mg/day tablets.  Patient denies issues with medication, denies cravings for drugs, or withdrawal symptoms.    Phase of treatment: Maintenance    Today's Updates  01/02/24:  Coverage- Delon Agent, NP  Mom passed away a few years ago, just found out that parents were actually divorced in 2006 which has caused stress.  Needs to make changes to death certificate.  Sees Katie for counseling.  Diazepam decreased to 5 mg BID and 2 mg nightly.  Tapering has been challenging for pt.  Will be tested for Parkinsons the beginning of Nov- has tremors, memory issues, easy falling when not in wheelchair.  The patient's current dose is buprenorphine /naloxone  tabs 24 mg daily.  This dose is adequate for the patient and there are no withdrawal symptoms, cravings, or side effects.  Dental problems including dental caries, abscesses, and damaged teeth may be associated with the use of buprenorphine  formulations dissolved in the mouth.  Patient instructed to rinse around the teeth and gums with water after buprenorphine  administration and should notify their dentist that they are taking the drug and follow up with routine scheduled dental care.        12/20/23  Today's issues:   Appt with katie today.    - 14-day prescriptions, nx tabs 8-2 mg, 3 tablets daily.  Generally takes the full daily dose but states she has several tablets left, unsure the amount.    At follow-up patient will tell me the amount that she has left so that prescription can be adjusted to prevent large surplus. 9/10 as of this had 3 remaining.  30 days.  Being tested for Parkinsons.  Coming off Diazepam.  See below.    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    As of today patient  denies any illicit use , cravings or side effects.  Doing well on suboxone  for over a year.  Denies ETOH, she takes diazepam 5 mg x2 + diazepam 2 mg daily ( lowered from 10 mg tablets) and denies mis-use   No new allergies or meds  Declines tapering at this time  Wishes to continue on dose, buprenorphine /nx tablets 24 mg daily.   Thc from dispensary  Pt will continue counseling with [name] biweekly.  Appt next week.   Apt in disabled and elderly complex.    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  Pt has previously had injections, didn't really work.  Has gotten motorized wheelchair - needs assistance up stairs.  Though she likes the [place] office, discussed changing facilities to [facility] office which has ramp.  She discussed a worry about getting into a van in her wheelchair, afraid she might fall off. Would think safeguards are in place - should discuss with provider so the pt doesn't limit her options.   Patient does live in a handicap accessible apartment.       MENTAL HEALTH:      psychopathology: Anxiety, Depression, Bipolar, PTSD, Panic Attacks    psychopharmacology: Zyreca, Lamictal, Prazosin, Diazepam    psychiatric med provider: [name]      past psychiatric hx: (hospitalizations, self harm, SA, SI HI);  suicide attempt 1 yr ago     counseling:  [name], [facility], [place] - weekly    meetings:   No     SOCIAL ISSUES:     employment: disabled    housing:  Lives alone, rents in housing     family/support: Seperated; son is schizophrenic and is homeless - she worries about him    legal:   none    possess drivers license: no    current medical concerns: none    medical hx:  Seizure, HBP, Head Injury, CRPS left arm     surgical hx: none    stressors:   her health    pregnancy/ lmp/ contraception: has had hyst       There is no problem list on file for this patient.    Current Outpatient Medications   Medication Instructions     buprenorphine -naloxone  (Suboxone ) 8-2 mg SL tablet 1 tablet, sublingual, 3 times daily    carisoprodol (SOMA) 350 mg, oral, Daily PRN    diazePAM (VALIUM) 5 mg, oral, 3 times daily    lamoTRIgine (LaMICtal) 200 mg tablet 1 tablet, oral, Daily    naloxone  (NARCAN ) 4 mg, nasal, As needed, May repeat every 2-3 minutes if needed, alternating nostrils, until medical assistance becomes available.    OLANZapine (ZyPREXA) 2.5 mg tablet 1 tablet, oral, Nightly    OLANZapine (ZyPREXA) 20 mg tablet 1 tablet, oral, Nightly    prazosin (Minipress) 5 mg capsule 1 capsule, oral, Nightly    prazosin (MINIPRESS) 2 mg, oral, Nightly    propranolol (INDERAL) 10 mg, Daily PRN     Allergies   Allergen Reactions    Propoxyphene Other, Nausea Only and Unknown    Ultram  [Tramadol ]     Iodinated Contrast Media Other    Metaxalone Hives, Swelling and Unknown    Propoxyphene N-Acetaminophen Hives    Ciprofloxacin        OBJECTIVE    Last UDS/comment: appropriate / reviewed on this date 01/02/2024    Rx for diazepam     UDS reviewed.  Appropriate and consistent with PMP .   Last oral swab/comment:    Last pill/film count:     01/02/2024 PMP reviewed and is appropriate.               Labs:  HIV AB: none on record  HCV AB: none on record  LFTS: ast 14; alt 15 (oct 2023)       ASSESSMENT/PLAN  Patient continues to need ongoing care related to opioid use disorder.        Patient remains stable and free from illicit opioids on Suboxone  (or generic) 24 mg daily.  Continue current maintenance dose.     ERX#42 Suboxone /14D    Previously discussed the importance of Naloxone  (Narcan ) as a harm reduction strategy for opioid use disorder, emphasizing its role in reversing opioid overdose and recommending it for all patients prescribed opioids. Educated the patient on signs of overdose, proper administration, and the importance of having Narcan  accessible for themselves and those around them. Encouraged the patient to inform family or close contacts on its  use, and reviewed prescription availability through pharmacies and community programs. Patient expressed understanding and was advised to reach out with any questions. Prescription was not sent/ patient declined    Urine drug screen ordered to monitor compliance with buprenorphine  treatment and/or to evaluate for use of illicit substances.    Encouraged patient to maintain sobriety and required regular counseling.  Support given.  Follow up in 2 weeks. Call us  if any concerns.    Biweekly visits are medically necessary due to relapsing remitting nature of the disease.     Time spent: 10 minutes

## 2024-01-16 ENCOUNTER — Ambulatory Visit
Admit: 2024-01-16 | Discharge: 2024-01-16 | Payer: MEDICARE | Attending: Obstetrics & Gynecology | Primary: Family Medicine

## 2024-01-16 ENCOUNTER — Ambulatory Visit: Admit: 2024-01-16 | Discharge: 2024-01-16 | Payer: MEDICARE | Primary: Family Medicine

## 2024-01-16 DIAGNOSIS — F1120 Opioid dependence, uncomplicated: Principal | ICD-10-CM

## 2024-01-16 MED ORDER — buprenorphine-naloxone (Suboxone) 8-2 mg SL tablet
8-2 | ORAL_TABLET | Freq: Three times a day (TID) | SUBLINGUAL | 0 refills | 28.00000 days | Status: DC
Start: 2024-01-16 — End: 2024-01-30

## 2024-01-16 NOTE — Progress Notes (Signed)
 [facility]  [address], [address]  [place] [address]  [phone]      Patient ID: [name] is a 48 y.o. female who presents for Opioid Dependence.    SUBJECTiVE    Chief Complaint:  Opioid Use Disorder.  Patient is taking Suboxone  (buprenorphine /naloxone ) 24 mg/day tablets.  Patient denies issues with medication, denies cravings for drugs, or withdrawal symptoms.    Phase of treatment: Maintenance    Today's Updates  01/16/24  Opioid dependence:The patient's current dose is buprenorphine /naloxone  tabs 24 mg daily.  This dose is adequate for the patient and there are no withdrawal symptoms, cravings, or side effects.  She is not interested in taper.  -Thc from dispensary.  Psychoharmacology: Prescriber ,[name], psychiatric NP  Benzodiazepine taper - Diazepam decreased to 5 mg BID and 2 mg nightly.  Tapering has been challenging for pt.  Counseling:  Counseling with [name] biweekly.  Appt today  Medical: Will be tested for Parkinsons the beginning of Nov-complains of  tremors, memory issues, poor balance.  Pt scheduled for labwork 02/10/24, [facility].  [facility] intial labs today sent to [facility] to be done in addition to prescribed medical lab work.  Social: has apt in disabled and elderly complex.  Follow-up 2 weeks.  Lab request to [facility] today for HIV, hepatic panel, LFTs.  14-day prescription today for buprenorphine /nx film 24 mg daily.      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  Pt has previously had injections, didn't really work.  Has gotten motorized wheelchair - needs assistance up stairs.  Though she likes the [place] office, discussed changing facilities to [facility] office which has ramp.  She discussed a worry about getting into a van in her wheelchair, afraid she might fall off. Would think safeguards are in place - should discuss with provider so the pt doesn't limit her options.   Patient does live in a  handicap accessible apartment.       MENTAL HEALTH:      psychopathology: Anxiety, Depression, Bipolar, PTSD, Panic Attacks    psychopharmacology: Zyreca, Lamictal, Prazosin, Diazepam    psychiatric med provider: [name]      past psychiatric hx: (hospitalizations, self harm, SA, SI HI); suicide attempt 1 yr ago     counseling:  [name], [facility], [place] - weekly    meetings:   No     SOCIAL ISSUES:     employment: disabled    housing:  Lives alone, rents in housing     family/support: Seperated; son is schizophrenic and is homeless - she worries about him    legal:   none    possess drivers license: no    current medical concerns: none    medical hx:  Seizure, HBP, Head Injury, CRPS left arm     surgical hx: none    stressors:   her health    pregnancy/ lmp/ contraception: has had hyst       There is no problem list on file for this patient.    Current Outpatient Medications   Medication Instructions    buprenorphine -naloxone  (Suboxone ) 8-2 mg SL tablet 1 tablet, sublingual, 3 times daily    carisoprodol (SOMA) 350 mg, oral, Daily PRN    diazePAM (VALIUM) 5 mg, oral, 2 times daily    diazePAM (VALIUM) 2 mg, Nightly PRN    lamoTRIgine (LaMICtal) 200 mg tablet 1 tablet, oral,  Daily    naloxone  (NARCAN ) 4 mg, nasal, As needed, May repeat every 2-3 minutes if needed, alternating nostrils, until medical assistance becomes available.    OLANZapine (ZyPREXA) 2.5 mg tablet 1 tablet, oral, Nightly    OLANZapine (ZyPREXA) 20 mg tablet 1 tablet, oral, Nightly    prazosin (Minipress) 5 mg capsule 1 capsule, oral, Nightly    prazosin (MINIPRESS) 2 mg, oral, Nightly    propranolol (INDERAL) 10 mg, Daily PRN    propranolol (INDERAL) 20 mg, Daily PRN     Allergies   Allergen Reactions    Propoxyphene Other, Nausea Only and Unknown    Ultram  [Tramadol ]     Iodinated Contrast Media Other    Metaxalone Hives, Swelling and Unknown    Propoxyphene N-Acetaminophen Hives    Ciprofloxacin        OBJECTIVE    Last  UDS/comment: appropriate / reviewed on this date 01/16/2024    Rx for diazepam       Last oral swab/comment:    Last pill/film count:     01/16/2024 PMP reviewed and is appropriate.               Labs:  HIV AB: none on record  HCV AB: none on record  LFTS: ast 14; alt 15 (oct 2023)       ASSESSMENT/PLAN  Patient continues to need ongoing care related to opioid use disorder.    Issues Addressed         Codes      Uncomplicated opioid dependence    -  Primary F11.20    Relevant Orders    Urine Drug Screen            Patient remains stable and free from illicit opioids on Suboxone  (or generic) 24 mg daily.  Continue current maintenance dose.     ERX#42 Suboxone /14D    Previously discussed the importance of Naloxone  (Narcan ) as a harm reduction strategy for opioid use disorder, emphasizing its role in reversing opioid overdose and recommending it for all patients prescribed opioids. Educated the patient on signs of overdose, proper administration, and the importance of having Narcan  accessible for themselves and those around them. Encouraged the patient to inform family or close contacts on its use, and reviewed prescription availability through pharmacies and community programs. Patient expressed understanding and was advised to reach out with any questions. Prescription was not sent/ patient declined    Urine drug screen ordered to monitor compliance with buprenorphine  treatment and/or to evaluate for use of illicit substances.    Encouraged patient to maintain sobriety and required regular counseling.  Support given.  Follow up in 2 weeks. Call us  if any concerns.    Biweekly visits are medically necessary due to relapsing remitting nature of the disease.     Time spent: 15 minutes

## 2024-01-16 NOTE — Progress Notes (Cosign Needed)
 Progress Note      Client Name: Tasharra, Nodine D.O.B.  September 24, 1975   Stressor(s)/Significant Changes in Client's Condition     ?  No Significant Change from Last Visit    ?  Mood/Affect Euthymic   ?  Thought Process/Orientation Oriented x3   ?  Behavior/Functioning Congruent with mood   ?  Substance use Reports stable on MAT   Danger to:    x None ?  Self   ?  Others  ?  Property      ?  Ideation ? Plan  ? Intent   ?Attempt ? Other:   ?  Urges   ?  Cravings  Comments: Denies cravings/urges for illicit substance use    Therapeutic Intervention and Progress Toward Goal(s):    MW endorsed that she is feeling Very anxious right now, noting that I'm coming off of my anxiety meds. Clinician offered safe space for her to discuss this further. She reported that she worked with her provider through [facility] and had her last dose of her medication Diazepam Two days ago. Clinician inquired about coping skills and how she is utilizing them during this time. She reported that she will watch videos and/or Think of something else that is not a triggering subject. She reported that she believes I'm doing the right thing by getting off the benzos but did endorse that she was not happy with providers at [facility] and requested change. She noted that she will be meeting with the [facility]. Clinician recognized MW for advocating for her needs. She reported that this was something she discussed with her PCP. Clinician practiced empathic listening as MW discussed goals she has for her relationships with her children and how when they were younger I loved being a mom. She discussed the toll her health took on those relationships and how it has been something she has Beat myself up over. Clinician utilized CBT to address negative self talk. MW was receptive to discussion and identified how she is working toward the future she wants for her and her family. She reported that she has a Homemaker coming to her  apartment on Friday and will be receiving assistance. She returns as tele in two weeks. Most recent UDS reviewed. No current SI/AH/VH/HI and is agreeable to ask for help when needed.   Time spent: 43 minutes        Provider Signature/Credentials:  [name], LCSW   Date: 01/16/24   Referring Physician: Dr. Carmin

## 2024-01-30 ENCOUNTER — Ambulatory Visit
Admit: 2024-01-30 | Discharge: 2024-01-30 | Payer: MEDICARE | Attending: Obstetrics & Gynecology | Primary: Family Medicine

## 2024-01-30 DIAGNOSIS — F1120 Opioid dependence, uncomplicated: Principal | ICD-10-CM

## 2024-01-30 MED ORDER — buprenorphine-naloxone (Suboxone) 8-2 mg SL tablet
8-2 | ORAL_TABLET | Freq: Three times a day (TID) | SUBLINGUAL | 0 refills | 28.00000 days | Status: AC
Start: 2024-01-30 — End: 2024-02-13

## 2024-01-30 NOTE — Progress Notes (Signed)
 [facility]  [address], [address]  [place] [address]  [phone]      Patient ID: [name] is a 48 y.o. female who presents for Opioid Dependence.    SUBJECTiVE    Chief Complaint:  Opioid Use Disorder.  Patient is taking Suboxone  (buprenorphine /naloxone ) 24 mg/day tablets.  Patient denies issues with medication, denies cravings for drugs, or withdrawal symptoms.    Phase of treatment: Maintenance    Today's Updates  01/30/24    Last UDS/comment: appropriate / reviewed on this date 01/30/2024    Rx for diazepam         01/30/2024 PMP reviewed and is appropriate.       Visit 02/02/24        Opioid dependence:The patient's current dose is buprenorphine /naloxone  tabs 24 mg daily.  This dose is adequate for the patient and there are no withdrawal symptoms, cravings, or side effects.  She is not interested in taper.  -Thc from dispensary.  --No alcohol.  Psychoharmacology: Prescriber ,[name], psychiatric NP  Patient had stopped all pain medication late 12/2023 x 2 weeks.  Realized that some of the meds were helpful and now slowly being introduced.   Benzodiazepine taper - Diazepam decreased now to  2 mg as needed.  Tapering has been challenging but doing better.  Counseling:  Counseling with [name] biweekly.  Appt tomorrow (every other by Zoom)  Medical: Testing for Parkinsons scheduled early Nov-complains of  tremors, memory issues, poor balance.  Pt scheduled for labwork ordered by PCP 02/10/24, [facility].  [facility] intial labs 01/16/24 sent to [facility] to be done in addition to prescribed medical lab work.  Social: has apt in disabled and elderly complex.  Follow-up 2 weeks.    Lab request to [facility] 01/16/24 for HIV, hepatic panel, LFTs.      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  Pt has previously had injections, didn't really work.  Has gotten motorized wheelchair - needs assistance up stairs.  Though she likes the  [place] office, discussed changing facilities to [facility] office which has ramp.  She discussed a worry about getting into a van in her wheelchair, afraid she might fall off. Would think safeguards are in place - should discuss with provider so the pt doesn't limit her options.   Patient does live in a handicap accessible apartment.       MENTAL HEALTH:      psychopathology: Anxiety, Depression, Bipolar, PTSD, Panic Attacks    psychopharmacology: Zyreca, Lamictal, Prazosin, Diazepam    psychiatric med provider: Janna Ingram      past psychiatric hx: (hospitalizations, self harm, SA, SI HI); suicide attempt 1 yr ago     counseling:  [name], [facility], [place] - weekly    meetings:   No     SOCIAL ISSUES:     employment: disabled    housing:  Lives alone, rents in housing     family/support: Seperated; son is schizophrenic and is homeless - she worries about him    legal:   none    possess drivers license: no    current medical concerns: none    medical hx:  Seizure, HBP, Head Injury, CRPS left arm and feet.    surgical hx: none    stressors:   her health    pregnancy/ lmp/ contraception: has had hyst       There is no  problem list on file for this patient.    Current Outpatient Medications   Medication Instructions    buprenorphine -naloxone  (Suboxone ) 8-2 mg SL tablet 1 tablet, sublingual, 3 times daily    carisoprodol (SOMA) 350 mg, oral, Daily PRN    cyclobenzaprine (FLEXERIL) 10 mg, 3 times daily PRN    diazePAM (VALIUM) 2 mg, Nightly PRN    lamoTRIgine (LaMICtal) 25 mg tablet Take by mouth.    naloxone  (NARCAN ) 4 mg, nasal, As needed, May repeat every 2-3 minutes if needed, alternating nostrils, until medical assistance becomes available.    prazosin (MINIPRESS) 2 mg, oral, Nightly    propranolol (INDERAL) 20 mg, Daily PRN      Allergies   Allergen Reactions    Propoxyphene Other, Nausea Only and Unknown    Ultram  [Tramadol ]     Iodinated Contrast Media Other    Metaxalone Hives, Swelling and Unknown     Propoxyphene N-Acetaminophen Hives    Ciprofloxacin        OBJECTIVE  Labs:  HIV AB: none on record  HCV AB: none on record  LFTS: ast 14; alt 15 (oct 2023)       ASSESSMENT/PLAN  Patient continues to need ongoing care related to opioid use disorder.    Issues Addressed         Codes      Uncomplicated opioid dependence    -  Primary F11.20    Relevant Orders    Urine Drug Screen            Patient remains stable and free from illicit opioids on Suboxone  (or generic) 24 mg daily.  Continue current maintenance dose.     ERX#42 Suboxone /14D    Previously discussed the importance of Naloxone  (Narcan ) as a harm reduction strategy for opioid use disorder, emphasizing its role in reversing opioid overdose and recommending it for all patients prescribed opioids. Educated the patient on signs of overdose, proper administration, and the importance of having Narcan  accessible for themselves and those around them. Encouraged the patient to inform family or close contacts on its use, and reviewed prescription availability through pharmacies and community programs. Patient expressed understanding and was advised to reach out with any questions. Prescription was not sent/ patient declined    Urine drug screen ordered to monitor compliance with buprenorphine  treatment and/or to evaluate for use of illicit substances.    Pt. was provided a copy of the procedural policy reminder on 01/30/24 and expressed understanding.     Encouraged patient to maintain sobriety and required regular counseling.  Support given.  Follow up in 2 weeks. Call us  if any concerns.    Biweekly visits are medically necessary due to relapsing remitting nature of the disease.     Time spent: 15 minutes

## 2024-01-31 ENCOUNTER — Encounter: Admit: 2024-01-31 | Discharge: 2024-01-31 | Payer: MEDICARE | Primary: Family Medicine

## 2024-01-31 DIAGNOSIS — F1120 Opioid dependence, uncomplicated: Principal | ICD-10-CM

## 2024-01-31 NOTE — Progress Notes (Signed)
 Progress Note      Client Name: [name], Kerwood D.O.B.  Mar 21, 1976   Stressor(s)/Significant Changes in Client's Condition     ?  No Significant Change from Last Visit    ?  Mood/Affect Euthymic but noted she is not feeling well today   ?  Thought Process/Orientation Oriented x3   ?  Behavior/Functioning Congruent with mood   ?  Substance use Reports stable on MAT   Danger to:    x None ?  Self   ?  Others  ?  Property      ?  Ideation ? Plan  ? Intent   ?Attempt ? Other:   ?  Urges   ?  Cravings  Comments: Denies cravings/urges for illicit substance use    Therapeutic Intervention and Progress Toward Goal(s):    MW reported she is not feeling well this morning, noting I was up at two this morning and fell back asleep but woke up with a Sore throat and other sxs. Clinician offered safe space for MW to discuss her MH and how recently My PTSD has been horrible. She discussed this further, adding I had to kind of calm myself down. She reported that she continues to work with her MH providers at [facility] but still plans to change providers when there is availability. She reported that after being off of her medication she is now back to taking her medications under the care of her psychiatrist. Clinician recognized MW for working with her providers and getting the help that she needs. Clinician inquired about coping skills and reviewed these further with MW. MW reported that it has been helpful Just reminding myself of that (regarding adjusting to getting back on her medications) and she reported I know it's going to get better. Clinician inquired about SI. MW reported that in spite of recent MH related stressors I haven't been feeling suicidal. MW reflected on her past and some traumatic experiences that she has had. Clinician offered space for MW to discuss and process feelings related to these experiences further. She was pleased to report that she anticipates a Homemaker service will be meeting with her  Probably within two weeks, noting That will be helpful. Reviewed self care. MW reported that she plans on going out with a friend at some point to look for Halloween costumes. She returns in two weeks as tele. Most recent UDS reviewed. No current SI/AH/VH/HI and is agreeable to ask for help when needed. This real-time, interactive virtual Telehealth encounter was done by video with the patient's verbal consent. Two patient identifiers were used and confirmed. Physical location of home to the patient was located in [place] at the time of the visit.  Physical location of the provider: office. Other participants/involvement: none  Time spent: 59 minutes          Provider Signature/Credentials:  [name], LCSW   Date: 01/31/24   Referring Physician: Dr. Carmin

## 2024-02-13 ENCOUNTER — Ambulatory Visit
Admit: 2024-02-13 | Discharge: 2024-02-13 | Payer: MEDICARE | Attending: Obstetrics & Gynecology | Primary: Family Medicine

## 2024-02-13 DIAGNOSIS — F1120 Opioid dependence, uncomplicated: Principal | ICD-10-CM

## 2024-02-13 MED ORDER — BUPRENORPHINE 8 MG-NALOXONE 2 MG SUBLINGUAL TABLET
8-2 | ORAL_TABLET | Freq: Three times a day (TID) | SUBLINGUAL | 0 refills | 24.00000 days | Status: DC
Start: 2024-02-13 — End: 2024-02-28

## 2024-02-13 NOTE — Progress Notes (Signed)
 [facility]  [address], [address]  [place] [address]  [phone]      Patient ID: [name] is a 48 y.o. female who presents for Opioid Dependence.    SUBJECTiVE    Chief Complaint:  Opioid Use Disorder.  Patient is taking Suboxone  (buprenorphine /naloxone ) 24 mg/day tablets.  Patient denies issues with medication, denies cravings for drugs, or withdrawal symptoms.    Phase of treatment: Maintenance    Today's Updates  02/13/24    Last UDS/comment: appropriate / reviewed on this date 02/13/24  Rx for diazepam         02/13/2024 PMP reviewed and is appropriate.       Visit 02/13/24        Opioid dependence:The patient's current dose is buprenorphine /naloxone  tabs 24 mg daily.  This dose is adequate for the patient and there are no withdrawal symptoms, cravings, or side effects.  She is not interested in taper.  -Thc from dispensary.  --No alcohol.  Psychoharmacology: Prescriber ,[name], psychiatric NP, Danial Money.  Patient had stopped all pain medication late 12/2023 x 2 weeks.  Realized that some of the meds were helpful and now slowly being introduced.   Benzodiazepine taper - Diazepam decreased now to  2 mg 2x daily as needed.  Tapering has been challenging but doing better.  -- Also followed by [place],  IOP - come to house 1-2x/week. {eer to peer, also assistance with care needed.    Counseling:  Counseling with [name] biweekly.  Appt tomorrow (every other by Zoom)  Medical: Testing for Parkinsons (neurology appt) scheduled early Nov-complains of  tremors, memory issues, poor balance.  Pt scheduled for labwork ordered by PCP 02/10/24, [facility].  [facility] intial labs 01/16/24 sent to [facility] to be done in addition to prescribed medical lab work.  Social: has apt in disabled and elderly complex. Daughter 42, Thais, home schools her 3 children. Currently not talking to patient.  Son 70, schizophrenia, incarcerated at present x 1 year.  Son 51, autism,  lives with dad in  [place].  Verbal, starting [facility].   Follow-up 2 weeks.    Lab request to [facility] 01/16/24 for HIV, hepatic panel, LFTs - will do at PCP appt 02/25/24.    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  Pt has previously had injections, didn't really work.  Has gotten motorized wheelchair - needs assistance up stairs.  Though she likes the [facility] office, discussed changing facilities to [facility] office which has ramp.  She discussed a worry about getting into a van in her wheelchair, afraid she might fall off. Would think safeguards are in place - should discuss with provider so the pt doesn't limit her options.   Patient does live in a handicap accessible apartment.       MENTAL HEALTH:      psychopathology: Anxiety, Depression, Bipolar, PTSD, Panic Attacks    psychopharmacology: Zyreca, Lamictal, Prazosin, Diazepam    psychiatric med provider: [name]      past psychiatric hx: (hospitalizations, self harm, SA, SI HI); suicide attempt 1 yr ago     counseling:  [name], [facility], [place] - weekly    meetings:   No     SOCIAL ISSUES:     employment: disabled    housing:  Lives alone, rents in housing     family/support: Seperated; son is schizophrenic and is homeless - she  worries about him    legal:   none    possess drivers license: no    current medical concerns: none    medical hx:  Seizure, HBP, Head Injury, CRPS left arm and feet.    surgical hx: none    stressors:   her health    pregnancy/ lmp/ contraception: has had hyst       There is no problem list on file for this patient.    Current Outpatient Medications   Medication Instructions    buprenorphine -naloxone  (Suboxone ) 8-2 mg SL tablet 1 tablet, sublingual, 3 times daily    carisoprodol (SOMA) 350 mg, oral, Daily PRN    cyclobenzaprine (FLEXERIL) 10 mg, 3 times daily PRN    diazePAM (VALIUM) 2 mg, Nightly PRN    lamoTRIgine (LaMICtal) 25 mg tablet Take by mouth.    naloxone  (NARCAN ) 4 mg,  nasal, As needed, May repeat every 2-3 minutes if needed, alternating nostrils, until medical assistance becomes available.    prazosin (MINIPRESS) 2 mg, oral, Nightly    propranolol (INDERAL) 20 mg, Daily PRN      Allergies   Allergen Reactions    Propoxyphene Other, Nausea Only and Unknown    Ultram  [Tramadol ]     Iodinated Contrast Media Other    Metaxalone Hives, Swelling and Unknown    Propoxyphene N-Acetaminophen Hives    Ciprofloxacin        OBJECTIVE  Labs:  HIV AB: none on record  HCV AB: none on record  LFTS: ast 14; alt 15 (oct 2023)       ASSESSMENT/PLAN  Patient continues to need ongoing care related to opioid use disorder.    Issues Addressed         Codes      Uncomplicated opioid dependence    -  Primary F11.20    Relevant Orders    Urine Drug Screen            Patient remains stable and free from illicit opioids on Suboxone  (or generic) 24 mg daily.  Continue current maintenance dose.     ERX#42 Suboxone /14D    Previously discussed the importance of Naloxone  (Narcan ) as a harm reduction strategy for opioid use disorder, emphasizing its role in reversing opioid overdose and recommending it for all patients prescribed opioids. Educated the patient on signs of overdose, proper administration, and the importance of having Narcan  accessible for themselves and those around them. Encouraged the patient to inform family or close contacts on its use, and reviewed prescription availability through pharmacies and community programs. Patient expressed understanding and was advised to reach out with any questions. Prescription was not sent/ patient declined    Urine drug screen ordered to monitor compliance with buprenorphine  treatment and/or to evaluate for use of illicit substances.    Pt. was provided a copy of the procedural policy reminder on 01/30/24 and expressed understanding.     Encouraged patient to maintain sobriety and required regular counseling.  Support given.  Follow up in 2 weeks. Call us  if  any concerns.    Biweekly visits are medically necessary due to relapsing remitting nature of the disease.     Time spent: 10 minutes

## 2024-02-14 ENCOUNTER — Encounter: Admit: 2024-02-14 | Discharge: 2024-02-14 | Payer: MEDICARE | Primary: Family Medicine

## 2024-02-14 DIAGNOSIS — F1120 Opioid dependence, uncomplicated: Principal | ICD-10-CM

## 2024-02-14 NOTE — Progress Notes (Signed)
 Progress Note      Client Name: [name], [name]  D.O.B.  [birth year]   Stressor(s)/Significant Changes in Client's Condition     ?  No Significant Change from Last Visit    ?  Mood/Affect Euthymic   ?  Thought Process/Orientation Oriented x3   ?  Behavior/Functioning Congruent with mood   ?  Substance use Reports stable on MAT   Danger to:    x None ?  Self   ?  Others  ?  Property      ?  Ideation ? Plan  ? Intent   ?Attempt ? Other:   ?  Urges   ?  Cravings  Comments: Denies cravings/urges for illicit substance use    Therapeutic Intervention and Progress Toward Goal(s):    MW endorsed that she has been Struggling a little with the panic and anxiety. Clinician practiced empathic listening as MW discussed this further. She discussed how this is impacted by her relationships with her children and her past. Clinician and MW reviewed coping skills. She reported that she recently wrote a letter to her son where she Poured my heart out. She also noted My daughter contacted me yesterday and reviewed Boundaries that her daughter has set regarding their contact. MW reported For years I've been putting myself down to my children. Clinician and MW discussed this further. MW discussed how she is trying to Give myself grace for what happened in the past and reflected on the impact illness, chronic pain and mental health had on her family. Clinician utilized CBT to address MW negative self talk. MW was receptive to discussion. MW reported that she is pleased I'm talking to the kids a little bit more and that she is Looking forward to talking to my daughter again. Clinician and MW discussed and reviewed communication skills. She reported that she continues to work with her providers at [facility]. She returns in two weeks via tele. Most recent UDS reviewed. No current SI/AH/VH/HI and is agreeable to ask for help when needed. This real-time, interactive virtual Telehealth encounter was done by video with the patient's  verbal consent. Two patient identifiers were used and confirmed. Physical location of home to the patient was located in [place] at the time of the visit.  Physical location of the provider: office. Other participants/involvement: none  Time spent: 66 minutes          Provider Signature/Credentials:  [name], LCSW   Date: 02/14/24   Referring Physician: Dr. Carmin

## 2024-02-27 ENCOUNTER — Encounter: Payer: MEDICARE | Attending: Obstetrics & Gynecology | Primary: Family Medicine

## 2024-02-27 NOTE — Telephone Encounter (Addendum)
 [facility] Internet down  [facility] sending Rx's  Pt no showed appt no rx sent

## 2024-02-28 ENCOUNTER — Encounter
Admit: 2024-02-28 | Discharge: 2024-02-28 | Payer: MEDICARE | Attending: Obstetrics & Gynecology | Primary: Family Medicine

## 2024-02-28 ENCOUNTER — Encounter: Admit: 2024-02-28 | Discharge: 2024-02-28 | Payer: MEDICARE | Primary: Family Medicine

## 2024-02-28 DIAGNOSIS — F1120 Opioid dependence, uncomplicated: Principal | ICD-10-CM

## 2024-02-28 MED ORDER — BUPRENORPHINE 8 MG-NALOXONE 2 MG SUBLINGUAL TABLET
8-2 | ORAL_TABLET | Freq: Three times a day (TID) | SUBLINGUAL | 0 refills | 30.00000 days | Status: DC
Start: 2024-02-28 — End: 2024-03-12

## 2024-02-28 NOTE — Progress Notes (Unsigned)
 [facility]  [address]  [place] [address]  [phone]      Patient ID: [name] is a 48 y.o. female who presents for Opioid Dependence.    SUBJECTiVE    Chief Complaint:  Opioid Use Disorder.  Patient is taking Suboxone  (buprenorphine /naloxone ) 24 mg/day tablets.  Patient denies issues with medication, denies cravings for drugs, or withdrawal symptoms.    Phase of treatment: Maintenance    Today's Updates  02/13/24    Last UDS/comment: appropriate / reviewed on this date 02/13/24  Rx for diazepam         02/13/2024 PMP reviewed and is appropriate.       Visit 02/13/24        Opioid dependence:The patient's current dose is buprenorphine /naloxone  tabs 24 mg daily.  This dose is adequate for the patient and there are no withdrawal symptoms, cravings, or side effects.  She is not interested in taper.  -Thc from dispensary.  --No alcohol.  Psychoharmacology: Prescriber ,[name], psychiatric NP, Danial Money.  Patient had stopped all pain medication late 12/2023 x 2 weeks.  Realized that some of the meds were helpful and now slowly being introduced.   Benzodiazepine taper - Diazepam decreased now to  2 mg 2x daily as needed.  Tapering has been challenging but doing better.  -- Also followed by [place],  IOP - come to house 1-2x/week. {eer to peer, also assistance with care needed.    Counseling:  Counseling with [name] biweekly.  Appt tomorrow (every other by Zoom)  Medical: Testing for Parkinsons (neurology appt) scheduled early Nov-complains of  tremors, memory issues, poor balance.  Pt scheduled for labwork ordered by PCP 02/10/24, [facility].  [facility] intial labs 01/16/24 sent to [facility] to be done in addition to prescribed medical lab work.  Social: has apt in disabled and elderly complex. Daughter 70, Thais, home schools her 3 children. Currently not talking to patient.  Son 35, schizophrenia, incarcerated at present x 1 year.  Son 28, autism,  lives with dad in  [place].  Verbal, starting [facility].   Follow-up 2 weeks.    Lab request to [facility] 01/16/24 for HIV, hepatic panel, LFTs - will do at PCP appt 02/25/24.    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  Pt has previously had injections, didn't really work.  Has gotten motorized wheelchair - needs assistance up stairs.  Though she likes the [facility] office, discussed changing facilities to [facility] office which has ramp.  She discussed a worry about getting into a van in her wheelchair, afraid she might fall off. Would think safeguards are in place - should discuss with provider so the pt doesn't limit her options.   Patient does live in a handicap accessible apartment.       MENTAL HEALTH:      psychopathology: Anxiety, Depression, Bipolar, PTSD, Panic Attacks    psychopharmacology: Zyreca, Lamictal, Prazosin, Diazepam    psychiatric med provider: [name]      past psychiatric hx: (hospitalizations, self harm, SA, SI HI); suicide attempt 1 yr ago     counseling:  [name], [facility], [place] - weekly    meetings:   No     SOCIAL ISSUES:     employment: disabled    housing:  Lives alone, rents in housing     family/support: Seperated; son is schizophrenic and is homeless - she  worries about him    legal:   none    possess drivers license: no    current medical concerns: none    medical hx:  Seizure, HBP, Head Injury, CRPS left arm and feet.    surgical hx: none    stressors:   her health    pregnancy/ lmp/ contraception: has had hyst       There is no problem list on file for this patient.    Current Outpatient Medications   Medication Instructions    carisoprodol (SOMA) 350 mg, oral, Daily PRN    cyclobenzaprine (FLEXERIL) 10 mg, 3 times daily PRN    diazePAM (VALIUM) 2 mg, Nightly PRN    lamoTRIgine (LaMICtal) 25 mg tablet Take by mouth.    naloxone  (NARCAN ) 4 mg, nasal, As needed, May repeat every 2-3 minutes if needed, alternating nostrils, until  medical assistance becomes available.    prazosin (MINIPRESS) 2 mg, oral, Nightly    propranolol (INDERAL) 20 mg, Daily PRN      Allergies   Allergen Reactions    Propoxyphene Other, Nausea Only and Unknown    Ultram  [Tramadol ]     Iodinated Contrast Media Other    Metaxalone Hives, Swelling and Unknown    Propoxyphene N-Acetaminophen Hives    Ciprofloxacin        OBJECTIVE  Labs:  HIV AB: none on record  HCV AB: none on record  LFTS: ast 14; alt 15 (oct 2023)       ASSESSMENT/PLAN  Patient continues to need ongoing care related to opioid use disorder.          Patient remains stable and free from illicit opioids on Suboxone  (or generic) 24 mg daily.  Continue current maintenance dose.     ERX#42 Suboxone /14D    Previously discussed the importance of Naloxone  (Narcan ) as a harm reduction strategy for opioid use disorder, emphasizing its role in reversing opioid overdose and recommending it for all patients prescribed opioids. Educated the patient on signs of overdose, proper administration, and the importance of having Narcan  accessible for themselves and those around them. Encouraged the patient to inform family or close contacts on its use, and reviewed prescription availability through pharmacies and community programs. Patient expressed understanding and was advised to reach out with any questions. Prescription was not sent/ patient declined    Urine drug screen ordered to monitor compliance with buprenorphine  treatment and/or to evaluate for use of illicit substances.    Pt. was provided a copy of the procedural policy reminder on 01/30/24 and expressed understanding.     Encouraged patient to maintain sobriety and required regular counseling.  Support given.  Follow up in 2 weeks. Call us  if any concerns.    Biweekly visits are medically necessary due to relapsing remitting nature of the disease.     Time spent: 10 minutes

## 2024-02-28 NOTE — Progress Notes (Signed)
 [facility]  [address]  [place] [address]  [phone]      Patient ID: [name] is a 48 y.o. female who presents for Opioid Dependence.    SUBJECTiVE    Chief Complaint:  Opioid Use Disorder.  Patient is taking Suboxone  (buprenorphine /naloxone ) 24 mg/day tablets.  Patient denies issues with medication, denies cravings for drugs, or withdrawal symptoms.    Phase of treatment: Maintenance    Today's Updates - Telehealth  02/28/24    Last UDS/comment: appropriate / reviewed on this date 02/28/24  Rx for diazepam         02/28/2024 PMP reviewed and is appropriate.       Visit 02/28/24        [facility] labs yesterday.    Opioid dependence:The patient's current dose is buprenorphine /naloxone  tabs 24 mg daily.  This dose is adequate for the patient and there are no withdrawal symptoms, cravings, or side effects.  She is not interested in taper.  -Thc from dispensary.  --No alcohol.  Psychoharmacology: Prescriber ,[name], psychiatric NP, Danial Money.  Patient had stopped all pain medication late 12/2023 x 2 weeks.  Realized that some of the meds were helpful and now slowly being introduced.   Benzodiazepine taper - Diazepam decreased now to  2 mg 2x daily as needed.  Tapering has been challenging but doing better.  -- Also followed by [place],  IOP - come to house 1-2x/week. {eer to peer, also assistance with care needed.    Counseling:  Counseling with [name] biweekly.  Appt tomorrow (every other by Zoom)  Medical: Testing for Parkinsons (neurology appt) scheduled early Nov-complains of  tremors, memory issues, poor balance. NOV 26  Pt scheduled for labwork ordered by PCP 02/10/24, [facility].  [facility] intial labs 01/16/24 sent to [facility] to be done in addition to prescribed medical lab work.  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  has apt in disabled and elderly complex. Daughter 49, Thais, home schools her 3 children. Currently not talking to patient.  Son 92,  schizophrenia, incarcerated at present x 1 year.  Son 34, autism,  lives with dad in [place].  Verbal, starting [facility].   Follow-up 2 weeks.    Lab request to [facility] 01/16/24 for HIV, hepatic panel, LFTs - will do at PCP appt 02/25/24.    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  Pt has previously had injections, didn't really work.  Has gotten motorized wheelchair - needs assistance up stairs.  Though she likes the [place] office, discussed changing facilities to [facility] office which has ramp.  She discussed a worry about getting into a van in her wheelchair, afraid she might fall off. Would think safeguards are in place - should discuss with provider so the pt doesn't limit her options.   Patient does live in a handicap accessible apartment.       MENTAL HEALTH:      psychopathology: Anxiety, Depression, Bipolar, PTSD, Panic Attacks    psychopharmacology: Zyreca, Lamictal, Prazosin, Diazepam    psychiatric med provider: [name]      past psychiatric hx: (hospitalizations, self harm, SA, SI HI); suicide attempt 1 yr ago     counseling:  [name], [facility], [place] - weekly    meetings:   No     SOCIAL ISSUES:     employment: disabled    housing:  Lives alone,  rents in housing     family/support: Seperated; son is schizophrenic and is homeless - she worries about him    legal:   none    possess drivers license: no    current medical concerns: none    medical hx:  Seizure, HBP, Head Injury, CRPS left arm and feet.    surgical hx: none    stressors:   her health    pregnancy/ lmp/ contraception: has had hyst       There is no problem list on file for this patient.    Current Outpatient Medications   Medication Instructions    carisoprodol (SOMA) 350 mg, oral, Daily PRN    cyclobenzaprine (FLEXERIL) 10 mg, 3 times daily PRN    diazePAM (VALIUM) 2 mg, Nightly PRN    lamoTRIgine (LaMICtal) 25 mg tablet Take by mouth.    naloxone  (NARCAN ) 4 mg, nasal,  As needed, May repeat every 2-3 minutes if needed, alternating nostrils, until medical assistance becomes available.    prazosin (MINIPRESS) 2 mg, oral, Nightly    propranolol (INDERAL) 20 mg, Daily PRN      Allergies   Allergen Reactions    Propoxyphene Other, Nausea Only and Unknown    Ultram  [Tramadol ]     Iodinated Contrast Media Other    Metaxalone Hives, Swelling and Unknown    Propoxyphene N-Acetaminophen Hives    Ciprofloxacin        OBJECTIVE  Labs:  HIV AB: none on record  HCV AB: none on record  LFTS: ast 14; alt 15 (oct 2023)       ASSESSMENT/PLAN  Patient continues to need ongoing care related to opioid use disorder.    Patient remains stable and free from illicit opioids on Suboxone  (or generic) 24 mg daily.  Continue current maintenance dose.     ERX#42 Suboxone /14D    Previously discussed the importance of Naloxone  (Narcan ) as a harm reduction strategy for opioid use disorder, emphasizing its role in reversing opioid overdose and recommending it for all patients prescribed opioids. Educated the patient on signs of overdose, proper administration, and the importance of having Narcan  accessible for themselves and those around them. Encouraged the patient to inform family or close contacts on its use, and reviewed prescription availability through pharmacies and community programs. Patient expressed understanding and was advised to reach out with any questions. Prescription was not sent/ patient declined    Urine drug screen ordered to monitor compliance with buprenorphine  treatment and/or to evaluate for use of illicit substances.    Pt. was provided a copy of the procedural policy reminder on 01/30/24 and expressed understanding.     Encouraged patient to maintain sobriety and required regular counseling.  Support given.  Follow up in 2 weeks. Call us  if any concerns.    Biweekly visits are medically necessary due to relapsing remitting nature of the disease.     This real-time, interactive telehealth  encounter was conducted via video with the patient's verbal consent. Two patient identifiers were used and confirmed. The patient was physically located in [place], at other, during the visit, while the provider was in office. Other participants included none.   Time spent: 10 minutes

## 2024-02-28 NOTE — Progress Notes (Signed)
 Progress Note      Client Name: [name], [name] D.O.B.  24-Dec-1975   Stressor(s)/Significant Changes in Client's Condition     ?  No Significant Change from Last Visit    ?  Mood/Affect Euthymic   ?  Thought Process/Orientation Oriented x3   ?  Behavior/Functioning Congruent with mood   ?  Substance use Reports stable on MAT   Danger to:    x None ?  Self   ?  Others  ?  Property      ?  Ideation ? Plan  ? Intent   ?Attempt ? Other:   ?  Urges   ?  Cravings  Comments:  Denies cravings/urges for illicit substance use   Therapeutic Intervention and Progress Toward Goal(s):    MW reported things have Been up and down and that she continues to work with her psychiatry provider on medication changes. She noted I'm feeling a little better than I was. Clinician and MW discussed this further. MW reported that she Wrote a couple of letters to my youngest son. Clinician offered safe space for MW to process her feelings related to this action as she endorsed I was nervous. She discussed her other son who is currently incarcerated and how he had been in Disciplinary recently and she is now waiting to hear from him. She endorsed feeling Anxious Waiting for her son's calls even when he is able to call her as she reported he is not consistent. Clinician validated her feelings and possible ways to cope. Clinician offered encouragement for MW to consider working with her son to create a call schedule or agreement to speak on specific days. MW was receptive and noted she had not considered this. She returns in two weeks as televideo. No current SI/AH/VH/HI and is agreeable to ask for help when needed. This real-time, interactive virtual Telehealth encounter was done by video with the patient's verbal consent. Two patient identifiers were used and confirmed. Physical location of home to the patient was located in [place] at the time of the visit.  Physical location of the provider: office. Other participants/involvement:  none  Time spent: 48 minutes          Provider Signature/Credentials:  [name], LCSW   Date: 02/28/24   Referring Physician: Dr. Carmin

## 2024-03-12 ENCOUNTER — Encounter
Admit: 2024-03-12 | Discharge: 2024-03-12 | Payer: MEDICARE | Attending: Obstetrics & Gynecology | Primary: Family Medicine

## 2024-03-12 DIAGNOSIS — F1120 Opioid dependence, uncomplicated: Principal | ICD-10-CM

## 2024-03-12 MED ORDER — BUPRENORPHINE 8 MG-NALOXONE 2 MG SUBLINGUAL TABLET
8-2 | ORAL_TABLET | Freq: Three times a day (TID) | SUBLINGUAL | 0 refills | 29.00000 days | Status: DC
Start: 2024-03-12 — End: 2024-03-26

## 2024-03-12 NOTE — Addendum Note (Signed)
 Addended by: Francine Hannan on: 04/08/2024 05:54 PM     Modules accepted: Level of Service

## 2024-03-12 NOTE — Progress Notes (Signed)
 [facility]  [address], [address]  [place] [address]  [phone]      Patient ID: [name] is a 48 y.o. female who presents for Opioid Dependence.    SUBJECTiVE    Chief Complaint:  Opioid Use Disorder.  Patient is taking Suboxone  (buprenorphine /naloxone ) 24 mg/day tablets.  Patient denies issues with medication, denies cravings for drugs, or withdrawal symptoms.    Phase of treatment: Maintenance    03/12/24  Today's Updates - Telehealth due to snow storm  Last UDS/comment: appropriate / reviewed on this date 03/12/24  Rx for diazepam     02/28/24 visit was Telehealth    03/12/2024 PMP reviewed and is appropriate.       Visit 03/12/24      Labs 02/27/24, [facility]:    Tchol 193, HDL 46 L, trig169 H (<150), LDL118 H  CMP: Glucose 1 41H BUN 14, creatinine 0.79, AST 23, ALT 32 H (6-29)  CBC WBC 9.6, Hgb 14.4, HCT 43.4, PLT 292  Opioid dependence:The patient's current dose is buprenorphine /naloxone  tabs 24 mg daily.  This dose is adequate for the patient and there are no withdrawal symptoms, cravings, or side effects.  She is not interested in taper.  -Thc from dispensary.  --No alcohol.  Psychoharmacology: Prescriber ,[name], psychiatric NP, [facility]ial Money.  Patient had stopped all pain medication late 12/2023 x 2 weeks.  Realized that some of the meds were helpful and now slowly being introduced.   Benzodiazepine taper - Diazepam decreased now to  2 mg 2x daily as needed.  Tapering has been challenging but doing better.  -- Also followed by [place],  IOP - come to house 1-2x/week. {eer to peer, also assistance with care needed.    Counseling:  Counseling with [name] biweekly.  Appt tomorrow (every other by Zoom)  Medical: Complains of  tremors, memory issues, poor balance. Testing for Parkinson's rescheduled.  Waiting for call.    Social:    Apt in disabled and elderly complex. Daughter 40, lives in [place], home schools her 3 children. Currently not talking to patient.  Son 26,  schizophrenia, incarcerated at present x 1 year.  Son 43, autism,  lives with dad in [place].  Verbal, starting [employer].    Follow-up 2 weeks.       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  Pt has previously had injections, didn't really work.  Has gotten motorized wheelchair - needs assistance up stairs.  Though she likes the [place] office, discussed changing facilities to [facility] office which has ramp.  She discussed a worry about getting into a van in her wheelchair, afraid she might fall off. Would think safeguards are in place - should discuss with provider so the pt doesn't limit her options.   Patient does live in a handicap accessible apartment.       MENTAL HEALTH:      psychopathology: Anxiety, Depression, Bipolar, PTSD, Panic Attacks    psychopharmacology: Zyreca, Lamictal, Prazosin, Diazepam    psychiatric med provider: [name]      past psychiatric hx: (hospitalizations, self harm, SA, SI HI); suicide attempt 1 yr ago     counseling:  [name], [facility], [place] - weekly    meetings:   No     SOCIAL ISSUES:     employment: disabled    housing:  Lives alone in disabled and elderly complex.  Family/support: Daughter 50, lives in [place], home schools her 3 children. Currently not talking   to patient.  Son 70, schizophrenia, incarcerated at present x 1 year.  Son 31, autism,  lives with dad in [place].  Verbal, starting [employer].      legal:   none    possess drivers license: no    current medical concerns: evaluation pending for Parkinsons    medical hx:  Seizure, HBP, Head Injury, CRPS left arm and feet.    surgical hx: none    stressors:   her health    pregnancy/ lmp/ contraception: has had hyst       There is no problem list on file for this patient.    Current Outpatient Medications   Medication Instructions    buprenorphine -naloxone  (Suboxone ) 8-2 mg SL tablet 1 tablet, sublingual, 3 times  daily    carisoprodol (SOMA) 350 mg, oral, Daily PRN    cyclobenzaprine (FLEXERIL) 10 mg, 3 times daily PRN    diazePAM (VALIUM) 2 mg, Nightly PRN    lamoTRIgine (LaMICtal) 25 mg tablet Take by mouth.    naloxone  (NARCAN ) 4 mg, nasal, As needed, May repeat every 2-3 minutes if needed, alternating nostrils, until medical assistance becomes available.    prazosin (MINIPRESS) 2 mg, oral, Nightly    propranolol (INDERAL) 20 mg, Daily PRN      Allergies   Allergen Reactions    Propoxyphene Other, Nausea Only and Unknown    Ultram  [Tramadol ]     Iodinated Contrast Media Other    Metaxalone Hives, Swelling and Unknown    Propoxyphene N-Acetaminophen Hives    Ciprofloxacin        OBJECTIVE  Labs11/18/25, [facility]:    Tchol 193, HDL 46 L, trig169 H (<150), LDL118 H  CMP: Glucose 1 41H BUN 14, creatinine 0.79, AST 23, ALT 32 H (6-29)  CBC WBC 9.6, Hgb 14.4, HCT 43.4, PLT 292       ASSESSMENT/PLAN  Patient continues to need ongoing care related to opioid use disorder.    Patient remains stable and free from illicit opioids on Suboxone  (or generic) 24 mg daily.  Continue current maintenance dose.     ERX#42 Suboxone /14D    Previously discussed the importance of Naloxone  (Narcan ) as a harm reduction strategy for opioid use disorder, emphasizing its role in reversing opioid overdose and recommending it for all patients prescribed opioids. Educated the patient on signs of overdose, proper administration, and the importance of having Narcan  accessible for themselves and those around them. Encouraged the patient to inform family or close contacts on its use, and reviewed prescription availability through pharmacies and community programs. Patient expressed understanding and was advised to reach out with any questions. Prescription was not sent/ patient declined    Urine drug screen ordered to monitor compliance with buprenorphine  treatment and/or to evaluate for use of illicit substances.    Pt. was provided a copy of the procedural  policy reminder on 01/30/24 and expressed understanding.     Encouraged patient to maintain sobriety and required regular counseling.  Support given.  Follow up in 2 weeks. Call us  if any concerns.    Biweekly visits are medically necessary due to relapsing remitting nature of the disease.     This real-time, interactive telehealth encounter was conducted via video with the patient's verbal consent. Two patient identifiers were used and confirmed. The patient was physically located in [place], at home, during the visit, while the provider was in  home. Other participants included none.   Time spent: 10 minutes

## 2024-03-13 ENCOUNTER — Encounter: Admit: 2024-03-13 | Discharge: 2024-03-13 | Payer: MEDICARE | Primary: Family Medicine

## 2024-03-13 DIAGNOSIS — F1120 Opioid dependence, uncomplicated: Principal | ICD-10-CM

## 2024-03-13 NOTE — Progress Notes (Signed)
 Progress Note      Client Name: [name], [name]  D.O.B.  1975-11-11   Stressor(s)/Significant Changes in Client's Condition     ?  No Significant Change from Last Visit    ?  Mood/Affect Low   ?  Thought Process/Orientation Oriented x3   ?  Behavior/Functioning Congruent with mood   ?  Substance use Reports stable on MAT   Danger to:    x None ?  Self   ?  Others  ?  Property      ?  Ideation ? Plan  ? Intent   ?Attempt ? Other:   ?  Urges   ?  Cravings  Comments: Denies cravings/urges for illicit substance use    Therapeutic Intervention and Progress Toward Goal(s):    MW began session discussing her youngest son. She discussed some things from the past and how she questions why he did them (such as wearing a shirt that was too small and cutting his hair a certain way.) MW was receptive to discussion and why she questioned his actions. She discussed time she spent in [place] that was Almost three years when her children were younger and how this continues to be a source of guilt for her. Clinician utilized CBT/socratic questioning to discuss this further. MW also reflected on a past suicide attempt where her daughter drove her to the hospital. MW noted That was traumatic for her. Clinician and MW discussed how she is working on her relationship with her daughter and some of the recent exchanges they have had via text. MW reported I've been sticking to her boundaries. Clinician inquired about SI. MW denied current feelings of SI. She reported I'm almost to the right doses with my medication and that she continues to work with Danial for mental health care. She returns in two weeks. Most recent UDS reviewed. No current SI/AH/VH/HI and is agreeable to ask for help when needed. This real-time, interactive virtual Telehealth encounter was done by video with the patient's verbal consent. Two patient identifiers were used and confirmed. Physical location of home to the patient was located in [place] at the time of the  visit.  Physical location of the provider: office. Other participants/involvement: none  Time spent: 51 minutes         Provider Signature/Credentials:  [name], LCSW   Date: 03/13/24   Referring Physician: Dr. Carmin

## 2024-03-26 ENCOUNTER — Ambulatory Visit
Admit: 2024-03-26 | Discharge: 2024-03-26 | Payer: MEDICARE | Attending: Obstetrics & Gynecology | Primary: Family Medicine

## 2024-03-26 DIAGNOSIS — F1120 Opioid dependence, uncomplicated: Principal | ICD-10-CM

## 2024-03-26 MED ORDER — BUPRENORPHINE 8 MG-NALOXONE 2 MG SUBLINGUAL TABLET
8-2 | ORAL_TABLET | Freq: Three times a day (TID) | SUBLINGUAL | 0 refills | 28.00000 days | Status: DC
Start: 2024-03-26 — End: 2024-04-09

## 2024-03-26 NOTE — Progress Notes (Signed)
 This note is being signed by Dr. Redell Forts, Medical Director at [facility]. Note will be forwarded to the provider to create any addendums if necessary.

## 2024-03-26 NOTE — Progress Notes (Signed)
 [facility]  [address], [address]  [place] [address]  [phone]      Patient ID: [name] is a 48 y.o. female who presents for Opioid Dependence.    SUBJECTiVE    Chief Complaint:  Opioid Use Disorder.  Patient is taking Suboxone  (buprenorphine /naloxone ) 24 mg/day tablets.  Patient denies issues with medication, denies cravings for drugs, or withdrawal symptoms.    Phase of treatment: Maintenance    03/26/24  Today's Updates     Last UDS/comment: appropriate / reviewed on this date 03/26/24  Rx for diazepam     02/28/24 visit was Telehealth  03/12/24 visit was Telehealth    03/26/2024 PMP reviewed and is appropriate.       Visit 03/26/24   Opioid dependence:The patient's current dose is buprenorphine /naloxone  tabs 24 mg daily.  This dose is adequate for the patient and there are no withdrawal symptoms, cravings, or side effects.  She is not interested in taper.  -Thc from dispensary.  --No alcohol.  Psychoharmacology: Prescriber ,[name], psychiatric NP, [name].  Patient had stopped all pain medication late 12/2023 x 2 weeks.  Realized that some of the meds were helpful and now slowly being introduced.   Benzodiazepine taper - Diazepam decreased now to  2 mg 2x daily as needed.  Tapering has been challenging but doing better.  -- Also followed by [name],  IOP - come to house 1-2x/week. {peer to peer, also assistance with care needed.    Counseling:  Counseling with [name] biweekly.  Appt tomorrow (every other by Zoom)  Medical: Complains of  tremors, memory issues, poor balance. Testing for Parkinson's rescheduled. Has yet to hear about new appt.   Social:    Apt in disabled and elderly complex. Daughter 63, lives in [place], home schools her 3 children. Currently not talking to patient.  Son 56, schizophrenia, incarcerated at present x 1 year.  Son 82, autism,  lives with dad in [place].  Verbal, starting [employer].    Follow-up 2 weeks.      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  Pt has previously had injections, didn't really work.  Has gotten motorized wheelchair - needs assistance up stairs.  Though she likes the [place] office, discussed changing facilities to [facility] office which has ramp.  She discussed a worry about getting into a van in her wheelchair, afraid she might fall off. Would think safeguards are in place - should discuss with provider so the pt doesn't limit her options.   Patient does live in a handicap accessible apartment.       MENTAL HEALTH:      psychopathology: Anxiety, Depression, Bipolar, PTSD, Panic Attacks    psychopharmacology: Zyreca, Lamictal, Prazosin, Diazepam    psychiatric med provider: Janna Ingram      past psychiatric hx: (hospitalizations, self harm, SA, SI HI); suicide attempt 1 yr ago     counseling:  [name], [facility], [place] - weekly    meetings:   No     SOCIAL ISSUES:     employment: disabled    housing:  Lives alone, rents in housing     family/support: Seperated; son is schizophrenic and is homeless - she worries about him    legal:   none    possess drivers license: no    current medical concerns: none    medical hx:  Seizure, HBP,  Head Injury, CRPS left arm and feet.    surgical hx: none    stressors:   her health    pregnancy/ lmp/ contraception: has had hyst       There is no problem list on file for this patient.    Current Outpatient Medications   Medication Instructions    buprenorphine -naloxone  (Suboxone ) 8-2 mg SL tablet 1 tablet, sublingual, 3 times daily    cyclobenzaprine (FLEXERIL) 10 mg, 3 times daily PRN    diazePAM (VALIUM) 2 mg, Nightly PRN    lamoTRIgine (LaMICtal) 25 mg tablet Take by mouth.    naloxone  (NARCAN ) 4 mg, nasal, As needed, May repeat every 2-3 minutes if needed, alternating nostrils, until medical assistance becomes available.    omeprazole (PRILOSEC) 20 mg, oral, Daily    prazosin (MINIPRESS) 2 mg, oral, Nightly     propranolol (INDERAL) 20 mg, Daily PRN      Allergies   Allergen Reactions    Propoxyphene Other, Nausea Only and Unknown    Ultram  [Tramadol ]     Iodinated Contrast Media Other    Metaxalone Hives, Swelling and Unknown    Propoxyphene N-Acetaminophen Hives    Ciprofloxacin       Surgery - s/p TAH     contraception - NA    OBJECTIVE  Labs 02/27/24, [facility]:     Tchol 193, HDL 46 L, trig169 H (<150), LDL118 H   CMP: Glucose 1 41H BUN 14, creatinine 0.79, AST 23, ALT 32 H (6-29)   CBC WBC 9.6, Hgb 14.4, HCT 43.4, PLT 292       ASSESSMENT/PLAN  Patient continues to need ongoing care related to opioid use disorder.    Patient remains stable and free from illicit opioids on Suboxone  (or generic) 24 mg daily.  Continue current maintenance dose.     ERX#42 Suboxone /14D    Previously discussed the importance of Naloxone  (Narcan ) as a harm reduction strategy for opioid use disorder, emphasizing its role in reversing opioid overdose and recommending it for all patients prescribed opioids. Educated the patient on signs of overdose, proper administration, and the importance of having Narcan  accessible for themselves and those around them. Encouraged the patient to inform family or close contacts on its use, and reviewed prescription availability through pharmacies and community programs. Patient expressed understanding and was advised to reach out with any questions. Prescription was not sent/ patient declined    Urine drug screen ordered to monitor compliance with buprenorphine  treatment and/or to evaluate for use of illicit substances.    Pt. was provided a copy of the procedural policy reminder on 01/30/24 and expressed understanding.     Encouraged patient to maintain sobriety and required regular counseling.  Support given.  Follow up in 2 weeks. Call us  if any concerns.    Biweekly visits are medically necessary due to relapsing remitting nature of the disease.     Time spent: 15 minutes

## 2024-03-27 ENCOUNTER — Encounter: Admit: 2024-03-27 | Discharge: 2024-03-27 | Payer: MEDICARE | Primary: Family Medicine

## 2024-03-27 DIAGNOSIS — F1120 Opioid dependence, uncomplicated: Principal | ICD-10-CM

## 2024-03-27 NOTE — Progress Notes (Signed)
 Progress Note      Client Name: [name], [name] D.O.B.  05/14/1975   Stressor(s)/Significant Changes in Client's Condition     ?  No Significant Change from Last Visit    ?  Mood/Affect Euthymic   ?  Thought Process/Orientation Oriented x3   ?  Behavior/Functioning Congruent with mood   ?  Substance use Reports stable on MAT   Danger to:    x None ?  Self   ?  Others  ?  Property      ?  Ideation ? Plan  ? Intent   ?Attempt ? Other:   ?  Urges   ?  Cravings  Comments: Denies cravings/urges for illicit substance use    Therapeutic Intervention and Progress Toward Goal(s):    MW reported that she is currently helping her neighbor care for her cats while she is away. When asked how she has been, MW reported I thought I was doing okay but described how her son's incarceration is affecting her. She reported that she Was supposed to visit him tonight but he's in the hole. Clinician practiced empathic listening as MW further discussed her son and how she believes He's having a hard time, noting that she notified jail staff of this. She was pleased to report that she had received pictures of her grandchildren from her daughter. MW noted I'm waiting for something to happen (she further explained that she meant something that is negative) and how Pressure causes her to feel like I don't know what to do and I panic. Clinician and MW explored this further. Clinician and MW explored how she may utilize time that she may be thinking to help alleviate anxiety such as journaling or other activities that may be helpful. MW reported it is something she Used to journal and that she is receptive to possibly exploring this again. She discussed anxiety further as it pertains to her son As a mom I have those worries (as her son is currently incarcerated and she had been questioned by family as to why she feels this way.) She reported that she remains engaged in behavioral health services through [facility]. MW will continue to  consider the role of healthy coping skills and explore what may help to reduce anxiety. She returns in two weeks. Most recent UDS reviewed. No current SI/AH/VH/HI and is agreeable to ask for help when needed. This real-time, interactive virtual Telehealth encounter was done by video with the patient's verbal consent. Two patient identifiers were used and confirmed. Physical location of home to the patient was located in [place] at the time of the visit.  Physical location of the provider: office. Other participants/involvement: none  Time spent: 47 minutes          Provider Signature/Credentials:  [name], LCSW   Date: 03/27/24   Referring Physician: Dr. Carmin

## 2024-04-09 ENCOUNTER — Ambulatory Visit
Admit: 2024-04-09 | Discharge: 2024-04-09 | Payer: MEDICARE | Attending: Obstetrics & Gynecology | Primary: Family Medicine

## 2024-04-09 DIAGNOSIS — F1120 Opioid dependence, uncomplicated: Principal | ICD-10-CM

## 2024-04-09 MED ORDER — BUPRENORPHINE 8 MG-NALOXONE 2 MG SUBLINGUAL TABLET
8-2 | ORAL_TABLET | Freq: Three times a day (TID) | SUBLINGUAL | 0 refills | 28.00000 days | Status: DC
Start: 2024-04-09 — End: 2024-04-23

## 2024-04-09 NOTE — Progress Notes (Signed)
This note is being signed by Dr. Keviana Guida O'Connor, medical director at [facility].  Note will be forwarded to the provider to create any addendum's if necessary.

## 2024-04-09 NOTE — Progress Notes (Signed)
 [facility]  [address]  [place] [address]  [phone]      Patient ID: [name] is a 48 y.o. female who presents for Opioid Dependence.    SUBJECTiVE    Chief Complaint:  Opioid Use Disorder.  Patient is taking Suboxone  (buprenorphine /naloxone ) 24 mg/day tablets.  Patient denies issues with medication, denies cravings for drugs, or withdrawal symptoms.    Phase of treatment: Maintenance    04/09/24  Today's Updates     Last UDS/comment: appropriate / reviewed on this date 04/09/24  Rx for diazepam       04/09/2024 PMP reviewed and is appropriate.       Visit 04/09/24   Good Christmas - got to see daughter and grandchildren for the first time in year.  -Will do pending lab work this coming Friday.  - Neurology appointment 05/09/24   -Changes made to medication list.    Opioid dependence:The patient's current dose is buprenorphine /naloxone  tabs 24 mg daily.  This dose is adequate for the patient and there are no withdrawal symptoms, cravings, or side effects.  She is not interested in taper.  -Thc from dispensary.  --No alcohol.  Psychoharmacology: Prescriber ,[name], psychiatric NP, Danial Money.  Patient had stopped all pain medication late 12/2023 x 2 weeks.  Realized that some of the meds were helpful and now slowly being introduced.   Benzodiazepine taper - Diazepam decreased now to  2 mg 2x daily as needed.  Tapering has been challenging but doing better.  -- Also followed by [place],  IOP - come to house 1-2x/week. {peer to peer, also assistance with care needed.    Counseling:  Counseling with [name] biweekly.  Appt tomorrow (every other by Zoom)  Medical: Complains of  tremors, memory issues, poor balance. Testing for Parkinson's rescheduled to 05/09/24.  Social:    Apt in disabled and elderly complex. Daughter 69, lives in [place], home schools her 3 children.  Had not spoken or seen daughter and grandchildren for a year but reconnected on Christmas.  Son  58, schizophrenia, incarcerated at present x 1 year.  Son 77, autism,  lives with dad in [place].  Verbal, starting [employer].    Follow-up 2 weeks.      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  Pt has previously had injections, didn't really work.  Has gotten motorized wheelchair - needs assistance up stairs.  Though she likes the [place] office, discussed changing facilities to [facility] office which has ramp.  She discussed a worry about getting into a van in her wheelchair, afraid she might fall off. Would think safeguards are in place - should discuss with provider so the pt doesn't limit her options.   Patient does live in a handicap accessible apartment.       MENTAL HEALTH:      psychopathology: Anxiety, Depression, Bipolar, PTSD, Panic Attacks    psychopharmacology: Zyreca, Lamictal, Prazosin, Diazepam    psychiatric med provider: [name]      past psychiatric hx: (hospitalizations, self harm, SA, SI HI); suicide attempt 1 yr ago     counseling:  [name], [facility], [place] - weekly    meetings:   No     SOCIAL ISSUES:     employment: disabled    housing:  Lives alone, rents in housing     family/support: Seperated; son is schizophrenic and is homeless -  she worries about him    legal:   none    possess drivers license: no.  Has the ride and with current insurance able to get 5 free round trip [place] rides per month.    current medical concerns: none    medical hx:  Seizure, HBP, Head Injury, CRPS left arm and feet.    surgical hx: none    stressors:   her health    pregnancy/ lmp/ contraception: has had hyst       There is no problem list on file for this patient.    Current Outpatient Medications   Medication Instructions    buprenorphine -naloxone  (Suboxone ) 8-2 mg SL tablet 1 tablet, sublingual, 3 times daily    cyclobenzaprine (FLEXERIL) 10 mg, 3 times daily PRN    diazePAM (VALIUM) 2 mg, Nightly PRN    lamoTRIgine  (LaMICtal) 200 mg tablet 1 tablet, oral, Daily    naloxone  (NARCAN ) 4 mg, nasal, As needed, May repeat every 2-3 minutes if needed, alternating nostrils, until medical assistance becomes available.    OLANZapine (ZyPREXA) 15 mg tablet 1 tablet, Nightly    omeprazole (PRILOSEC) 20 mg, oral, Daily    prazosin (MINIPRESS) 2 mg, oral, Nightly    prazosin (MINIPRESS) 5 mg, oral, Nightly    propranolol (INDERAL) 20 mg, Daily PRN         Allergies   Allergen Reactions    Propoxyphene Other, Nausea Only and Unknown    Ultram  [Tramadol ]     Iodinated Contrast Media Other    Metaxalone Hives, Swelling and Unknown    Propoxyphene N-Acetaminophen Hives    Ciprofloxacin       Surgery - s/p TAH     contraception - NA    OBJECTIVE  Labs 02/27/24, [facility]:     Tchol 193, HDL 46 L, trig169 H (<150), LDL118 H   CMP: Glucose 1 41H BUN 14, creatinine 0.79, AST 23, ALT 32 H (6-29)   CBC WBC 9.6, Hgb 14.4, HCT 43.4, PLT 292       ASSESSMENT/PLAN  Patient continues to need ongoing care related to opioid use disorder.    Patient remains stable and free from illicit opioids on Suboxone  (or generic) 24 mg daily.  Continue current maintenance dose.     ERX#42 Suboxone /14D    Previously discussed the importance of Naloxone  (Narcan ) as a harm reduction strategy for opioid use disorder, emphasizing its role in reversing opioid overdose and recommending it for all patients prescribed opioids. Educated the patient on signs of overdose, proper administration, and the importance of having Narcan  accessible for themselves and those around them. Encouraged the patient to inform family or close contacts on its use, and reviewed prescription availability through pharmacies and community programs. Patient expressed understanding and was advised to reach out with any questions. Prescription was not sent/ patient declined    Urine drug screen ordered to monitor compliance with buprenorphine  treatment and/or to evaluate for use of illicit substances.    Pt.  was provided a copy of the procedural policy reminder on 01/30/24 and expressed understanding.     Encouraged patient to maintain sobriety and required regular counseling.  Support given.  Follow up in 2 weeks. Call us  if any concerns.    Biweekly visits are medically necessary due to relapsing remitting nature of the disease.     Time spent: 15 minutes

## 2024-04-10 ENCOUNTER — Encounter: Admit: 2024-04-10 | Discharge: 2024-04-10 | Payer: MEDICARE | Primary: Family Medicine

## 2024-04-10 DIAGNOSIS — F1120 Opioid dependence, uncomplicated: Principal | ICD-10-CM
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
## 2024-04-23 ENCOUNTER — Ambulatory Visit
Admit: 2024-04-23 | Discharge: 2024-04-23 | Payer: MEDICARE | Attending: Obstetrics & Gynecology | Primary: Family Medicine

## 2024-04-23 DIAGNOSIS — F1120 Opioid dependence, uncomplicated: Principal | ICD-10-CM

## 2024-04-23 MED ORDER — BUPRENORPHINE 8 MG-NALOXONE 2 MG SUBLINGUAL TABLET
8-2 | ORAL_TABLET | Freq: Three times a day (TID) | SUBLINGUAL | 0 refills | 28.00000 days | Status: DC
Start: 2024-04-23 — End: 2024-05-02

## 2024-04-23 NOTE — Progress Notes (Signed)
 [facility]  [address]  [place] [address]  [phone]      Patient ID: [name] is a 49 y.o. female who presents for Opioid Dependence.    SUBJECTiVE    Chief Complaint:  Opioid Use Disorder.  Patient is taking Suboxone  (buprenorphine /naloxone ) 24 mg/day tablets.  Patient denies issues with medication, denies cravings for drugs, or withdrawal symptoms.    Phase of treatment: Maintenance    04/23/24  Today's Updates     Last UDS/comment: appropriate / reviewed on this date 04/23/24  Rx for diazepam       04/09/2024 PMP reviewed and is appropriate.       Visit  04/23/24  Feell twice in house. X-rays showed brusitis of left hip.  Lab work done today.   Changing psychopharma cologist.  Appt tomorrow.  Doesn't like lowering of diazepam 30mg  now 4 mg.   - Neurology appointment 05/09/24        Opioid dependence:The patient's current dose is buprenorphine /naloxone  tabs 24 mg daily.  This dose is adequate for the patient and there are no withdrawal symptoms, cravings, or side effects.  She is not interested in taper.  -Thc from dispensary.  --No alcohol.  Psychoharmacology: Prescriber ,[name], psychiatric NP, Danial Money.  Patient had stopped all pain medication late 12/2023 x 2 weeks.  Realized that some of the meds were helpful and now slowly being introduced.   Benzodiazepine taper - Diazepam decreased now to  2 mg 2x daily as needed.  Tapering has been challenging but doing better.  -- Also followed by [place],  IOP - come to house 1-2x/week. {peer to peer, also assistance with care needed.    Counseling:  Counseling with [name] biweekly.  Appt tomorrow (every other by Zoom)  Medical: Complains of  tremors, memory issues, poor balance. Testing for Parkinson's rescheduled to 05/09/24.  Social:    Apt in disabled and elderly complex. Daughter 20, lives in [place], home schools her 3 children.  Had not spoken or seen daughter and grandchildren for a year but reconnected on  Christmas.  Son 77, schizophrenia, incarcerated at present x 1 year.  Son 67, autism,  lives with dad in [place].  Verbal, starting [employer].    Follow-up 2 weeks.      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  Pt has previously had injections, didn't really work.  Has gotten motorized wheelchair - needs assistance up stairs.  Though she likes the [place] office, discussed changing facilities to [facility] office which has ramp.  She discussed a worry about getting into a van in her wheelchair, afraid she might fall off. Would think safeguards are in place - should discuss with provider so the pt doesn't limit her options.   Patient does live in a handicap accessible apartment.       MENTAL HEALTH:      psychopathology: Anxiety, Depression, Bipolar, PTSD, Panic Attacks    psychopharmacology: Zyreca, Lamictal, Prazosin, Diazepam    psychiatric med provider: [name]      past psychiatric hx: (hospitalizations, self harm, SA, SI HI); suicide attempt 1 yr ago     counseling:  [name], [facility], [facility] - weekly    meetings:   No     SOCIAL ISSUES:     employment: disabled    housing:  Lives alone, rents in housing     family/support:  Seperated; son is schizophrenic and is homeless - she worries about him    legal:   none    possess drivers license: no.  Has the ride and with current insurance able to get 5 free round trip [place] rides per month.    current medical concerns: none    medical hx:  Seizure, HBP, Head Injury, CRPS left arm and feet.    surgical hx: none    stressors:   her health    pregnancy/ lmp/ contraception: has had hyst       There is no problem list on file for this patient.    Current Outpatient Medications   Medication Instructions    buprenorphine -naloxone  (Suboxone ) 8-2 mg SL tablet 1 tablet, sublingual, 3 times daily    cyclobenzaprine (FLEXERIL) 10 mg, 3 times daily PRN    diazePAM (VALIUM) 2 mg, Nightly PRN     lamoTRIgine (LaMICtal) 200 mg tablet 1 tablet, oral, Daily    naloxone  (NARCAN ) 4 mg, nasal, As needed, May repeat every 2-3 minutes if needed, alternating nostrils, until medical assistance becomes available.    OLANZapine (ZyPREXA) 15 mg tablet 1 tablet, Nightly    omeprazole (PRILOSEC) 20 mg, oral, Daily    prazosin (MINIPRESS) 2 mg, oral, Nightly    prazosin (MINIPRESS) 5 mg, oral, Nightly    propranolol (INDERAL) 20 mg, Daily PRN         Allergies   Allergen Reactions    Propoxyphene Other, Nausea Only and Unknown    Ultram  [Tramadol ]     Iodinated Contrast Media Other    Metaxalone Hives, Swelling and Unknown    Propoxyphene N-Acetaminophen Hives    Ciprofloxacin       Surgery - s/p TAH     contraception - NA    OBJECTIVE  Labs 02/27/24, [facility]:     Tchol 193, HDL 46 L, trig169 H (<150), LDL118 H   CMP: Glucose 1 41H BUN 14, creatinine 0.79, AST 23, ALT 32 H (6-29)   CBC WBC 9.6, Hgb 14.4, HCT 43.4, PLT 292       ASSESSMENT/PLAN  Patient continues to need ongoing care related to opioid use disorder.    Patient remains stable and free from illicit opioids on Suboxone  (or generic) 24 mg daily.  Continue current maintenance dose.     ERX#42 Suboxone /14D    Previously discussed the importance of Naloxone  (Narcan ) as a harm reduction strategy for opioid use disorder, emphasizing its role in reversing opioid overdose and recommending it for all patients prescribed opioids. Educated the patient on signs of overdose, proper administration, and the importance of having Narcan  accessible for themselves and those around them. Encouraged the patient to inform family or close contacts on its use, and reviewed prescription availability through pharmacies and community programs. Patient expressed understanding and was advised to reach out with any questions. Prescription was not sent/ patient declined    Urine drug screen ordered to monitor compliance with buprenorphine  treatment and/or to evaluate for use of illicit  substances.    Pt. was provided a copy of the procedural policy reminder on 01/30/24 and expressed understanding.     Encouraged patient to maintain sobriety and required regular counseling.  Support given.  Follow up in 2 weeks. Call us  if any concerns.    Biweekly visits are medically necessary due to relapsing remitting nature of the disease.     Time spent: 10 minutes

## 2024-04-24 ENCOUNTER — Encounter: Admit: 2024-04-24 | Discharge: 2024-04-24 | Payer: MEDICARE | Primary: Family Medicine

## 2024-04-24 DIAGNOSIS — F1120 Opioid dependence, uncomplicated: Principal | ICD-10-CM

## 2024-04-24 LAB — HEPATIC FUNCTION PANEL
A/G Ratio: 2 (calc) (ref 1.0–2.5)
ALT: 21 U/L (ref 6–29)
AST: 16 U/L (ref 10–35)
Albumin: 4.5 g/dL (ref 3.6–5.1)
Alkaline Phosphatase: 78 U/L (ref 31–125)
Bilirubin, total: 0.5 mg/dL (ref 0.2–1.2)
EXT BILIRUBIN, DIRECT: 0.1 mg/dL (ref <=0.2)
EXT BILIRUBIN, INDIRECT: 0.4 mg/dL (ref 0.2–1.2)
Globulin, Total: 2.2 g/dL (ref 1.9–3.7)
Protein, total: 6.7 g/dL (ref 6.1–8.1)

## 2024-04-24 LAB — HEPATITIS B SURFACE ANTIGEN: HEPATITIS B SURFACE ANTIGEN: NONREACTIVE

## 2024-04-24 LAB — HIV AB/AG SCREEN
HIV 1+2 Ab+HIV1 p24 Ag SerPl Ql IA: NEGATIVE
HIV AG/AB, 4TH GEN: NONREACTIVE

## 2024-04-24 LAB — HEPATITIS A ANTIBODY, TOTAL: HEPATITIS A AB, TOTAL: NONREACTIVE

## 2024-04-24 LAB — HEPATITIS B SURFACE ANTIBODY QUANT: HEPATITIS B SURFACE AB IMMUNITY, QN: <5 m[IU]/mL — ABNORMAL LOW (ref >=10)

## 2024-04-24 LAB — HCV AB W/REFLEX QUANT PCR (SCREENING): HEPATITIS C ANTIBODY: NONREACTIVE

## 2024-04-24 NOTE — H&P (Signed)
 Reviewed labs done yesterday, 04/23/24, with patient:   HIV neg, hepatitis A, B, and C all negative, LFTs WNL.    All labs within normal limits.  Pointed out that patient does not have immunity to hepatitis B and may wish to discuss with PCP).

## 2024-04-24 NOTE — Progress Notes (Signed)
 Progress Note      Client Name: [name], Zweig D.O.B.  19-Oct-1975   Stressor(s)/Significant Changes in Client's Condition     ?  No Significant Change from Last Visit    ?  Mood/Affect Low   ?  Thought Process/Orientation Oriented x3   ?  Behavior/Functioning WNL   ?  Substance use Reports stable on MAT   Danger to:    x None ?  Self   ?  Others  ?  Property      x  Ideation ? Plan  ? Intent   ?Attempt ? Other: **Recent past passive SI-see note   ?  Urges   ?  Cravings  Comments: Denies cravings/urges for illicit substance use    Therapeutic Intervention and Progress Toward Goal(s):    MW discussed having a recent blood draw and the rationale for it, endorsing some anxiety surrounding this. Clinician and MW discussed this further. Clinician validated her feelings. MW reported that as of right now, her son will be released from incarceration at the Beginning of April. MW reported that she has been experiencing Flashes of seeing her mother when her mother passed away. Clinician asked clarifying questions. MW reported that it is close to her mother's Dortha and death date. Clinician offered space for MW to explore this further. She endorsed past passive SI and that she discussed this with her mental health care providers outside of [facility]. She noted of this feeling It passed. Denied plans/intent/attempts. MW reported that she has an appointment with a possible new psychiatry provider and discussed why she is looking into meeting with a new provider. Clinician offered support. She reported that her homemaker assistance will be starting soon and It will be really helpful. Reviewed self-care. MW reported that she is considering going to the casino this week and I'm thinking about getting a kitten.  No current SI/AH/VH/HI and is agreeable to ask for help when needed. This real-time, interactive virtual Telehealth encounter was done by video with the patient's verbal consent. Two patient identifiers were used and  confirmed. Physical location of home to the patient was located in [place] at the time of the visit.  Physical location of the provider: office. Other participants/involvement: none  Time spent: 55 minutes          Provider Signature/Credentials:  [name], LCSW   Date: 04/24/24   Referring Physician: Dr. Carmin

## 2024-05-07 ENCOUNTER — Ambulatory Visit
Admit: 2024-05-07 | Discharge: 2024-05-07 | Payer: MEDICARE | Attending: Obstetrics & Gynecology | Primary: Family Medicine

## 2024-05-07 DIAGNOSIS — F1120 Opioid dependence, uncomplicated: Principal | ICD-10-CM

## 2024-05-07 MED ORDER — BUPRENORPHINE 8 MG-NALOXONE 2 MG SUBLINGUAL TABLET
8-2 | ORAL_TABLET | Freq: Three times a day (TID) | SUBLINGUAL | 0 refills | 28.00000 days | Status: AC
Start: 2024-05-07 — End: 2024-05-21

## 2024-05-07 NOTE — Progress Notes (Unsigned)
 [facility]  [address]  [place] [address]  [phone]      Patient ID: [name] is a 49 y.o. female who presents for Opioid Dependence.    SUBJECTiVE    Chief Complaint:  Opioid Use Disorder.  Patient is taking Suboxone  (buprenorphine /naloxone ) 24 mg/day tablets.  Patient denies issues with medication, denies cravings for drugs, or withdrawal symptoms.    Phase of treatment: Maintenance    1/127/26  Today's Updates     Last UDS/comment: appropriate / reviewed on this date 04/23/24  Rx for diazepam     04/23/24 visit was Telehealth    05/07/24 PMP reviewed and is appropriate.       Visit  05/07/24  - Neurology appointment 05/09/24    Opioid dependence:The patient's current dose is buprenorphine /naloxone  tabs 24 mg daily.  This dose is adequate for the patient and there are no withdrawal symptoms, cravings, or side effects.  She is not interested in taper.  -Thc from dispensary.  --No alcohol.  Nicotine - cutting down, now 17 cig/day.  Taking away 1 cig/month.  Discussed Chantix.  Will talk with PCP.    Psychoharmacology: New presciber, Eva Mutto.  Diazepam discontinued, and started on Clonazepam.  Appt tomorrow, 1/28.   Lab work done.  Changing psychopharma cologist.  Appt tomorrow.  Doesn't like lowering of diazepam 30mg  now 4 mg.   Patient had stopped all pain medication late 12/2023 x 2 weeks.  Realized that some of the meds were helpful and now slowly being introduced.   Benzodiazepine taper - Diazepam decreased now to  2 mg 2x daily as needed.  Tapering has been challenging but doing better.  -- Also followed by [place],  IOP - come to house 1-2x/week. {peer to peer, also assistance with care needed.    Counseling:  Counseling with [name] biweekly.  Appt tomorrow (every other by Zoom)  Medical: Complains of  tremors, memory issues, poor balance. Testing for Parkinson's rescheduled to 05/09/24.  Social:    Apt in disabled and elderly complex. Daughter 25, lives in  [place], home schools her 3 children.  Had not spoken or seen daughter and grandchildren for a year but reconnected on Christmas.  Son 2, schizophrenia, incarcerated at present x 1 year.  Son 36, autism,  lives with dad in [place].  Verbal, starting [employer].    Follow-up 2 weeks.      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  Pt has previously had injections, didn't really work.  Has gotten motorized wheelchair - needs assistance up stairs.  Though she likes the [place] office, discussed changing facilities to [facility] office which has ramp.  She discussed a worry about getting into a van in her wheelchair, afraid she might fall off. Would think safeguards are in place - should discuss with provider so the pt doesn't limit her options.   Patient does live in a handicap accessible apartment.     MENTAL HEALTH:      psychopathology: Anxiety, Depression, Bipolar, PTSD, Panic Attacks    psychopharmacology: Zyreca, Lamictal, Prazosin, Diazepam    psychiatric med provider: [name]      past psychiatric hx: (hospitalizations, self harm, SA, SI HI); suicide attempt 1 yr ago     counseling:  [name], [facility], [place] - weekly    meetings:   No     SOCIAL ISSUES:  employment: disabled    housing:  Lives alone, rents in housing     family/support: Seperated; son is schizophrenic and is homeless - she worries about him    legal:   none    possess drivers license: no.  Has the ride and with current insurance able to get 5 free round trip [place] rides per month.    current medical concerns: none    medical hx:  Seizure, HBP, Head Injury, CRPS left arm and feet.    surgical hx: none    stressors:   her health    pregnancy/ lmp/ contraception: has had hyst       There is no problem list on file for this patient.    Current Outpatient Medications   Medication Instructions    buprenorphine -naloxone  (Suboxone ) 8-2 mg SL tablet 1 tablet,  sublingual, 3 times daily    cyclobenzaprine (FLEXERIL) 10 mg, 3 times daily PRN    diazePAM (VALIUM) 2 mg, Nightly PRN    lamoTRIgine (LaMICtal) 200 mg tablet 1 tablet, oral, Daily    naloxone  (NARCAN ) 4 mg, nasal, As needed, May repeat every 2-3 minutes if needed, alternating nostrils, until medical assistance becomes available.    OLANZapine (ZyPREXA) 15 mg tablet 1 tablet, Nightly    omeprazole (PRILOSEC) 20 mg, oral, Daily    prazosin (MINIPRESS) 2 mg, oral, Nightly    prazosin (MINIPRESS) 5 mg, oral, Nightly    propranolol (INDERAL) 20 mg, Daily PRN         Allergies   Allergen Reactions    Propoxyphene Other, Nausea Only and Unknown    Ultram  [Tramadol ]     Iodinated Contrast Media Other    Metaxalone Hives, Swelling and Unknown    Propoxyphene N-Acetaminophen Hives    Ciprofloxacin       Surgery - s/p TAH     contraception - NA    OBJECTIVE  Labs 02/27/24, [facility]:     Tchol 193, HDL 46 L, trig169 H (<150), LDL118 H   CMP: Glucose 1 41H BUN 14, creatinine 0.79, AST 23, ALT 32 H (6-29)   CBC WBC 9.6, Hgb 14.4, HCT 43.4, PLT 292       ASSESSMENT/PLAN  Patient continues to need ongoing care related to opioid use disorder.    Patient remains stable and free from illicit opioids on Suboxone  (or generic) 24 mg daily.  Continue current maintenance dose.     ERX#42 Suboxone /14D    Previously discussed the importance of Naloxone  (Narcan ) as a harm reduction strategy for opioid use disorder, emphasizing its role in reversing opioid overdose and recommending it for all patients prescribed opioids. Educated the patient on signs of overdose, proper administration, and the importance of having Narcan  accessible for themselves and those around them. Encouraged the patient to inform family or close contacts on its use, and reviewed prescription availability through pharmacies and community programs. Patient expressed understanding and was advised to reach out with any questions. Prescription was not sent/ patient  declined    Urine drug screen ordered to monitor compliance with buprenorphine  treatment and/or to evaluate for use of illicit substances.    Pt. was provided a copy of the procedural policy reminder on 01/30/24 and expressed understanding.     Encouraged patient to maintain sobriety and required regular counseling.  Support given.  Follow up in 2 weeks. Call us  if any concerns.    Biweekly visits are medically necessary due to relapsing remitting nature of the disease.  Time spent: *** minutes

## 2024-05-08 ENCOUNTER — Encounter: Admit: 2024-05-08 | Discharge: 2024-05-08 | Payer: MEDICARE | Primary: Family Medicine

## 2024-05-08 DIAGNOSIS — F1120 Opioid dependence, uncomplicated: Principal | ICD-10-CM
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
## 2024-05-09 NOTE — Telephone Encounter (Signed)
 Patient called to give her Provider an update. Patient states she was supposed to see a neurologist today, that office called and canceled and told the patient they can not schedule her until June. Patient stated she is calling another place to see if they can get her in sooner. Patient wanted Dr. Carmin to know what was going on.

## 2024-05-21 ENCOUNTER — Encounter: Payer: MEDICARE | Primary: Family Medicine

## 2024-05-22 ENCOUNTER — Encounter: Payer: MEDICARE | Primary: Family Medicine

## 2024-06-04 ENCOUNTER — Encounter: Payer: MEDICARE | Primary: Family Medicine
